# Patient Record
Sex: Female | Born: 1965 | Race: White | Hispanic: No | Marital: Married | Smoking: Former smoker
Health system: Southern US, Community
[De-identification: ages and names within clinical notes are randomized; demographics above are authoritative.]

## PROBLEM LIST (undated history)

## (undated) DIAGNOSIS — J189 Pneumonia, unspecified organism: Secondary | ICD-10-CM

## (undated) DIAGNOSIS — G43909 Migraine, unspecified, not intractable, without status migrainosus: Secondary | ICD-10-CM

## (undated) DIAGNOSIS — T7840XA Allergy, unspecified, initial encounter: Secondary | ICD-10-CM

## (undated) DIAGNOSIS — K219 Gastro-esophageal reflux disease without esophagitis: Secondary | ICD-10-CM

## (undated) DIAGNOSIS — M1990 Unspecified osteoarthritis, unspecified site: Secondary | ICD-10-CM

## (undated) DIAGNOSIS — N390 Urinary tract infection, site not specified: Secondary | ICD-10-CM

## (undated) DIAGNOSIS — I480 Paroxysmal atrial fibrillation: Secondary | ICD-10-CM

## (undated) DIAGNOSIS — F419 Anxiety disorder, unspecified: Secondary | ICD-10-CM

## (undated) HISTORY — PX: WRIST SURGERY: SHX841

## (undated) HISTORY — DX: Pneumonia, unspecified organism: J18.9

## (undated) HISTORY — DX: Anxiety disorder, unspecified: F41.9

## (undated) HISTORY — DX: Unspecified osteoarthritis, unspecified site: M19.90

## (undated) HISTORY — DX: Gastro-esophageal reflux disease without esophagitis: K21.9

## (undated) HISTORY — DX: Allergy, unspecified, initial encounter: T78.40XA

## (undated) HISTORY — PX: TUBAL LIGATION: SHX77

## (undated) HISTORY — DX: Migraine, unspecified, not intractable, without status migrainosus: G43.909

## (undated) HISTORY — PX: OTHER SURGICAL HISTORY: SHX169

## (undated) HISTORY — DX: Urinary tract infection, site not specified: N39.0

---

## 1998-01-09 ENCOUNTER — Inpatient Hospital Stay (HOSPITAL_COMMUNITY): Admission: AD | Admit: 1998-01-09 | Discharge: 1998-01-09 | Payer: Self-pay | Admitting: Obstetrics and Gynecology

## 1998-01-12 ENCOUNTER — Inpatient Hospital Stay (HOSPITAL_COMMUNITY): Admission: AD | Admit: 1998-01-12 | Discharge: 1998-01-12 | Payer: Self-pay | Admitting: Obstetrics and Gynecology

## 1998-01-12 ENCOUNTER — Inpatient Hospital Stay (HOSPITAL_COMMUNITY): Admission: AD | Admit: 1998-01-12 | Discharge: 1998-01-15 | Payer: Self-pay | Admitting: Obstetrics and Gynecology

## 1998-04-27 ENCOUNTER — Ambulatory Visit (HOSPITAL_BASED_OUTPATIENT_CLINIC_OR_DEPARTMENT_OTHER): Admission: RE | Admit: 1998-04-27 | Discharge: 1998-04-27 | Payer: Self-pay | Admitting: General Surgery

## 1998-06-18 ENCOUNTER — Emergency Department (HOSPITAL_COMMUNITY): Admission: EM | Admit: 1998-06-18 | Discharge: 1998-06-18 | Payer: Self-pay | Admitting: Emergency Medicine

## 1998-06-18 ENCOUNTER — Encounter: Payer: Self-pay | Admitting: Emergency Medicine

## 1998-06-24 ENCOUNTER — Ambulatory Visit (HOSPITAL_COMMUNITY): Admission: RE | Admit: 1998-06-24 | Discharge: 1998-06-24 | Payer: Self-pay | Admitting: Obstetrics and Gynecology

## 1998-07-03 ENCOUNTER — Other Ambulatory Visit: Admission: RE | Admit: 1998-07-03 | Discharge: 1998-07-03 | Payer: Self-pay | Admitting: Obstetrics and Gynecology

## 1999-08-04 ENCOUNTER — Ambulatory Visit (HOSPITAL_COMMUNITY): Admission: RE | Admit: 1999-08-04 | Discharge: 1999-08-04 | Payer: Self-pay | Admitting: Emergency Medicine

## 1999-08-04 ENCOUNTER — Encounter: Payer: Self-pay | Admitting: Emergency Medicine

## 1999-09-16 ENCOUNTER — Other Ambulatory Visit: Admission: RE | Admit: 1999-09-16 | Discharge: 1999-09-16 | Payer: Self-pay | Admitting: Obstetrics and Gynecology

## 2000-11-30 ENCOUNTER — Other Ambulatory Visit: Admission: RE | Admit: 2000-11-30 | Discharge: 2000-11-30 | Payer: Self-pay | Admitting: Obstetrics and Gynecology

## 2001-11-28 ENCOUNTER — Ambulatory Visit (HOSPITAL_COMMUNITY): Admission: RE | Admit: 2001-11-28 | Discharge: 2001-11-28 | Payer: Self-pay | Admitting: General Surgery

## 2001-11-28 ENCOUNTER — Encounter (INDEPENDENT_AMBULATORY_CARE_PROVIDER_SITE_OTHER): Payer: Self-pay | Admitting: Specialist

## 2003-06-10 ENCOUNTER — Encounter: Admission: RE | Admit: 2003-06-10 | Discharge: 2003-06-10 | Payer: Self-pay | Admitting: Obstetrics and Gynecology

## 2003-09-13 ENCOUNTER — Emergency Department (HOSPITAL_COMMUNITY): Admission: AD | Admit: 2003-09-13 | Discharge: 2003-09-13 | Payer: Self-pay | Admitting: Family Medicine

## 2003-09-16 HISTORY — PX: COLONOSCOPY: SHX174

## 2004-04-06 ENCOUNTER — Emergency Department (HOSPITAL_COMMUNITY): Admission: EM | Admit: 2004-04-06 | Discharge: 2004-04-06 | Payer: Self-pay | Admitting: Family Medicine

## 2004-04-06 ENCOUNTER — Ambulatory Visit (HOSPITAL_COMMUNITY): Admission: RE | Admit: 2004-04-06 | Discharge: 2004-04-06 | Payer: Self-pay | Admitting: Family Medicine

## 2005-01-21 ENCOUNTER — Emergency Department (HOSPITAL_COMMUNITY): Admission: EM | Admit: 2005-01-21 | Discharge: 2005-01-21 | Payer: Self-pay | Admitting: Emergency Medicine

## 2005-01-24 ENCOUNTER — Encounter (HOSPITAL_COMMUNITY): Admission: RE | Admit: 2005-01-24 | Discharge: 2005-02-23 | Payer: Self-pay | Admitting: Emergency Medicine

## 2005-05-05 ENCOUNTER — Encounter: Admission: RE | Admit: 2005-05-05 | Discharge: 2005-05-05 | Payer: Self-pay | Admitting: Family Medicine

## 2005-06-15 ENCOUNTER — Emergency Department (HOSPITAL_COMMUNITY): Admission: EM | Admit: 2005-06-15 | Discharge: 2005-06-15 | Payer: Self-pay | Admitting: Family Medicine

## 2005-08-15 ENCOUNTER — Encounter: Admission: RE | Admit: 2005-08-15 | Discharge: 2005-08-15 | Payer: Self-pay | Admitting: Gastroenterology

## 2005-09-19 ENCOUNTER — Encounter: Admission: RE | Admit: 2005-09-19 | Discharge: 2005-09-19 | Payer: Self-pay | Admitting: Obstetrics and Gynecology

## 2005-09-29 ENCOUNTER — Other Ambulatory Visit: Admission: RE | Admit: 2005-09-29 | Discharge: 2005-09-29 | Payer: Self-pay | Admitting: Obstetrics and Gynecology

## 2006-01-18 ENCOUNTER — Emergency Department (HOSPITAL_COMMUNITY): Admission: EM | Admit: 2006-01-18 | Discharge: 2006-01-19 | Payer: Self-pay | Admitting: Emergency Medicine

## 2006-04-07 ENCOUNTER — Ambulatory Visit (HOSPITAL_COMMUNITY): Admission: RE | Admit: 2006-04-07 | Discharge: 2006-04-07 | Payer: Self-pay | Admitting: Obstetrics and Gynecology

## 2006-06-05 ENCOUNTER — Encounter: Admission: RE | Admit: 2006-06-05 | Discharge: 2006-06-05 | Payer: Self-pay | Admitting: Family Medicine

## 2006-06-05 ENCOUNTER — Ambulatory Visit: Payer: Self-pay | Admitting: Family Medicine

## 2006-07-28 ENCOUNTER — Emergency Department (HOSPITAL_COMMUNITY): Admission: EM | Admit: 2006-07-28 | Discharge: 2006-07-28 | Payer: Self-pay | Admitting: Family Medicine

## 2006-09-21 ENCOUNTER — Encounter: Admission: RE | Admit: 2006-09-21 | Discharge: 2006-09-21 | Payer: Self-pay | Admitting: Obstetrics and Gynecology

## 2007-02-16 ENCOUNTER — Ambulatory Visit (HOSPITAL_COMMUNITY): Admission: RE | Admit: 2007-02-16 | Discharge: 2007-02-16 | Payer: Self-pay | Admitting: Obstetrics and Gynecology

## 2007-05-17 ENCOUNTER — Ambulatory Visit: Payer: Self-pay | Admitting: Family Medicine

## 2007-06-12 ENCOUNTER — Ambulatory Visit: Payer: Self-pay | Admitting: Family Medicine

## 2007-09-28 ENCOUNTER — Encounter: Admission: RE | Admit: 2007-09-28 | Discharge: 2007-09-28 | Payer: Self-pay | Admitting: Obstetrics and Gynecology

## 2008-02-18 ENCOUNTER — Emergency Department (HOSPITAL_COMMUNITY): Admission: EM | Admit: 2008-02-18 | Discharge: 2008-02-18 | Payer: Self-pay | Admitting: *Deleted

## 2008-09-29 ENCOUNTER — Encounter: Admission: RE | Admit: 2008-09-29 | Discharge: 2008-09-29 | Payer: Self-pay | Admitting: Obstetrics and Gynecology

## 2008-12-17 ENCOUNTER — Emergency Department (HOSPITAL_COMMUNITY): Admission: EM | Admit: 2008-12-17 | Discharge: 2008-12-17 | Payer: Self-pay | Admitting: Family Medicine

## 2008-12-17 ENCOUNTER — Emergency Department (HOSPITAL_COMMUNITY): Admission: EM | Admit: 2008-12-17 | Discharge: 2008-12-18 | Payer: Self-pay | Admitting: Emergency Medicine

## 2009-09-30 ENCOUNTER — Encounter: Admission: RE | Admit: 2009-09-30 | Discharge: 2009-09-30 | Payer: Self-pay | Admitting: Obstetrics and Gynecology

## 2010-08-08 ENCOUNTER — Encounter: Payer: Self-pay | Admitting: Gastroenterology

## 2010-08-08 ENCOUNTER — Encounter: Payer: Self-pay | Admitting: Family Medicine

## 2010-08-13 ENCOUNTER — Other Ambulatory Visit: Payer: Self-pay | Admitting: Family Medicine

## 2010-08-13 DIAGNOSIS — Z1239 Encounter for other screening for malignant neoplasm of breast: Secondary | ICD-10-CM

## 2010-10-04 ENCOUNTER — Ambulatory Visit
Admission: RE | Admit: 2010-10-04 | Discharge: 2010-10-04 | Disposition: A | Discharge status: Home / Self Care | Payer: BC Managed Care – PPO | Source: Ambulatory Visit | Attending: Family Medicine | Admitting: Family Medicine

## 2010-10-04 DIAGNOSIS — Z1239 Encounter for other screening for malignant neoplasm of breast: Secondary | ICD-10-CM

## 2010-10-14 ENCOUNTER — Other Ambulatory Visit: Payer: Self-pay | Admitting: Dermatology

## 2010-10-25 LAB — WET PREP, GENITAL: Yeast Wet Prep HPF POC: NONE SEEN

## 2010-10-25 LAB — POCT URINALYSIS DIP (DEVICE)
Hgb urine dipstick: NEGATIVE
Ketones, ur: NEGATIVE mg/dL
Protein, ur: NEGATIVE mg/dL
Specific Gravity, Urine: <=1.005 (ref 1.005–1.030)
pH: 5.5 (ref 5.0–8.0)

## 2010-10-25 LAB — POCT I-STAT, CHEM 8
Creatinine, Ser: 0.9 mg/dL (ref 0.4–1.2)
Glucose, Bld: 75 mg/dL (ref 70–99)
HCT: 39 % (ref 36.0–46.0)
Hemoglobin: 13.3 g/dL (ref 12.0–15.0)
Sodium: 141 mEq/L (ref 135–145)
TCO2: 23 mmol/L (ref 0–100)

## 2010-10-25 LAB — GC/CHLAMYDIA PROBE AMP, GENITAL
Chlamydia, DNA Probe: NEGATIVE
GC Probe Amp, Genital: NEGATIVE

## 2010-11-11 ENCOUNTER — Other Ambulatory Visit: Payer: Self-pay | Admitting: Family Medicine

## 2010-11-11 DIAGNOSIS — R102 Pelvic and perineal pain: Secondary | ICD-10-CM

## 2010-11-12 ENCOUNTER — Ambulatory Visit
Admission: RE | Admit: 2010-11-12 | Discharge: 2010-11-12 | Disposition: A | Discharge status: Home / Self Care | Payer: BC Managed Care – PPO | Source: Ambulatory Visit | Attending: Family Medicine | Admitting: Family Medicine

## 2010-11-12 DIAGNOSIS — R102 Pelvic and perineal pain: Secondary | ICD-10-CM

## 2010-11-30 NOTE — Op Note (Signed)
NAMELOUISIANA, SEARLES           ACCOUNT NO.:  [id number]   MEDICAL RECORD NO.:  [id number]          PATIENT TYPE:  AMB   LOCATION:  SDC                           FACILITY:  [facility]   PHYSICIAN:  Zenaida Niece, M.D.DATE OF BIRTH:  Jan 10, 1966   DATE OF PROCEDURE:  02/16/2007  DATE OF DISCHARGE:                               OPERATIVE REPORT   PREOPERATIVE DIAGNOSIS:  Abnormal uterine bleeding.   POSTOPERATIVE DIAGNOSIS:  Abnormal uterine bleeding.   PROCEDURE:  Hysteroscopy with Novasure endometrial ablation.   SURGEON:  Zenaida Niece, MD.   ANESTHESIA:  IV sedation and paracervical block.   FINDINGS:  Normal uterine cavity, and the Novasure device used a cavity  length of 5 cm, cavity width of 2.9 cm and used 80 watts for 77 seconds.   SPECIMENS:  None.   ESTIMATED BLOOD LOSS:  Minimal.   COMPLICATIONS:  None.   PROCEDURE IN DETAIL:  The patient was taken to the operating room and  placed in the dorsosupine position.  She was given IV sedation and  placed in mobile stirrups.  Perineum and vagina were then prepped and  draped in the usual sterile fashion and bladder drained with a latex-  free catheter.  A Graves speculum was inserted into the vagina, and 2 cc  of 2% plain lidocaine was infiltrated at 12 o'clock.  The cervix was  then grasped with a single-tooth tenaculum.  A deep paracervical block  was then performed with a total of 16 cc of 2% plain lidocaine.  The  uterus then sounded to 9 cm, and the cervix measured 4 cm.  The cervix  was gradually dilated to a size-19 dilator, and the small hysteroscope  was inserted, and good visualization was achieved.  Both tubal ostia  were identified, there were no endometrial lesions, and the cavity was  normal.  The hysteroscope was removed.  The cervix was gradually dilated  to accommodate a size-8 Hegar dilator.  The Novasure device was then  inserted without difficulty.  It was deployed appropriately without  problems.   The CO2 test passed, and endometrial ablation was performed  with the above-mentioned settings without complications.  The device was  removed and found to be intact.  The single-tooth  tenaculum was removed from the cervix and bleeding controlled with  pressure.  All instruments were then removed from the vagina.  The  patient tolerated the procedure well and was taken to the recovery room  in stable condition.  Counts were correct.      Zenaida Niece, M.D.  Electronically Signed     TDM/MEDQ  D:  02/16/2007  T:  02/16/2007  Job:  [id number]

## 2010-12-03 NOTE — Op Note (Signed)
Kim Irwin, Kim Irwin           ACCOUNT NO.:  [id number]   MEDICAL RECORD NO.:  [id number]          PATIENT TYPE:  AMB   LOCATION:  SDC                           FACILITY:  [facility]   PHYSICIAN:  Zenaida Niece, M.D.DATE OF BIRTH:  Aug 01, 1965   DATE OF PROCEDURE:  04/07/2006  DATE OF DISCHARGE:  04/07/2006                                 OPERATIVE REPORT   PREOPERATIVE DIAGNOSIS:  Pelvic pain and desires surgical sterility.   POSTOPERATIVE DIAGNOSIS:  Pelvic pain and desires surgical sterility.   PROCEDURE:  Diagnostic laparoscopy with bilateral tubal fulguration.   SURGEON:  Zenaida Niece, M.D.   ANESTHESIA:  General endotracheal tube.   SPECIMENS:  None.   ESTIMATED BLOOD LOSS:  Minimal.   COMPLICATIONS:  None.   FINDINGS:  She had normal female pelvic anatomy and upper abdominal anatomy.   PROCEDURE IN DETAIL:  The patient was taken to the operating room and placed  in the dorsal supine position.  General anesthesia was induced and she was  placed in mobile stirrups.  The abdomen was prepped and draped in the usual  sterile fashion, the bladder drained with a red rubber catheter, Hulka  tenaculum applied to the cervix for uterine manipulation.  Infraumbilical  skin was then infiltrated with 0.25% Marcaine and a 1 cm vertical incision  was made in the periumbilical region.  The Veress needle was inserted in the  peritoneal cavity and placement confirmed by the water drop test and opening  pressure of 4 mmHg.  CO2 gas was insufflated to a pressure of 10 mmHg and  the Veress needle was removed.  The 5-mm XL trocar was then introduced with  direct visualization with the laparoscope.  A 5-mm port was then placed on  the left side, also under direct visualization.  Inspection revealed the  above mentioned findings.  Sh had a normal liver edge and normal appendix  and gallbladder.  The uterus was normal. Tubes and ovaries were normal.  No  evidence of endometriosis or  adhesions.  Both fallopian tubes were  identified and traced to their fimbriated ends.  Three segments of the  middle of each tube were fulgurated with bipolar cautery until the amp meter  read 0 in all spots.  The pelvis was again inspected and no source for her  pain was identified.  The 5 mL port on the left side was removed under  direct visualization.  The laparoscope was removed and all gas allowed to  deflate from the abdomen.  The umbilical trocar was then removed.  Skin  incisions  were closed with interrupted subcuticular sutures of 4-0 Vicryl followed by  Dermabond.  The Hulka tenaculum was removed from the cervix.  The patient  tolerated the procedure well and was taken to the recovery room in stable  condition.  Counts were correct and she had PAS hose on throughout the  procedure.      Zenaida Niece, M.D.  Electronically Signed     TDM/MEDQ  D:  04/07/2006  T:  04/09/2006  Job:  [id number]

## 2010-12-03 NOTE — H&P (Signed)
[name], Irwin           ACCOUNT NO.:  [id number]   MEDICAL RECORD NO.:  [id number]          PATIENT TYPE:  AMB   LOCATION:  SDC                           FACILITY:  [facility]   PHYSICIAN:  Zenaida Niece, M.D.DATE OF BIRTH:  1966/04/09   DATE OF ADMISSION:  04/07/2006  DATE OF DISCHARGE:                                HISTORY & PHYSICAL   CHIEF COMPLAINT:  Pelvic pain.   HISTORY OF PRESENT ILLNESS:  This is a 45 year old white female, gravida 1  para 1, whom I saw for an annual exam in March 2007.  At that time, she was  having occasional spotting or light periods and no significant problems.  I  then saw her back in April 2007, at which time she complained of having  pelvic pain off and on since August 2006.  In April 2007, it was becoming  more constant and more to the right and the middle, radiating to her back,  and associated with pelvic bloating especially with voiding.  She also had  some constipation.  She said the pain was better; she felt some better when  lying flat with a heating pad.  She also has some dyspareunia when she is on  her back, but it is better when she is on her knees.  She also has mild  nausea, but no emesis, and no fever or chills.  She says that she was seen  in November 2006 for the same symptoms, but nobody was able to find an  etiology.  I do not have record of her being seen for that here in our  office.  Exam at that time revealed a benign abdomen that was slightly  tender in the suprapubic and right lower quadrant areas.  Pelvic exam  revealed an anteverted to midplanar possibly slightly tender uterus and  possibly slightly tender bladder.  She does have a Mirena IUD in place, and  the strings were appropriate at approximately 2 cm.  At that time, she was  placed on doxycycline 100 mg b.i.d. and to take over-the-counter NSAIDs to  see how this helped.  She then required Vicoprofen to control her pain, and  the pain continued to worsen.  She  was seen in June 2007, again saying that  the pain was still the same.  She did have a pelvic ultrasound performed  initially on November 04, 2005, which revealed a moderate-size simple right  ovarian cyst.  Pelvic ultrasound was otherwise normal.  Repeat ultrasound on  December 27, 2005 revealed decreased size of the right ovarian cyst and a small  amount of free fluid, and again an otherwise normal ultrasound.  When I saw  the patient in June 2007, we discussed the options of observation, removing  the IUD, or proceeding to laparoscopy or possible hysterectomy.  She wished  to proceed with removal of the Mirena IUD at that time, and that was  performed.  A NuvaRing was placed for contraception.  She has continued to  require Vicoprofen for pain and wishes to proceed with laparoscopic  evaluation.  She also wants to proceed with permanent  surgical sterility.   PAST OB HISTORY:  One spontaneous vaginal delivery at term, without  complications.   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  By echocardiogram, she does have minimal mitral valve  prolapse, with mild mitral regurgitation, but this was otherwise normal.   PAST SURGICAL HISTORY:  Negative.   ALLERGIES:  None known.   CURRENT MEDICATIONS:  Vicoprofen p.r.n. pain.   SOCIAL HISTORY:  She is married, and denies alcohol, tobacco, or drug use.   FAMILY HISTORY:  No gyn or colon cancer.   REVIEW OF SYSTEMS:  She does have some fatigue and recent weight gain, and  again has fairly normal urinary and bowel function with some constipation.   PHYSICAL EXAMINATION:  GENERAL:  This is a well-developed female in no acute  distress.  Last weight is approximately 150 pounds.  NECK:  Supple, without lymphadenopathy or thyromegaly.  LUNGS:  Clear to auscultation.  HEART:  Regular rate and rhythm, without murmur.  ABDOMEN:  Soft, nondistended, without palpable masses.  She is slightly  tender in the  suprapubic area and right lower quadrant.  EXTREMITIES:  Have no edema and are nontender.  PELVIC:  External genitalia have no lesions.  On speculum, the cervix is  normal, and most recent Pap smear is normal.  On bimanual exam, she does  have a small anteverted to midplanar uterus which is slightly tender, and  she is slightly tender in the right adnexa and anteriorly, but no unusual  masses.   ASSESSMENT:  Chronic pelvic pain, with a normal ultrasound.  Pain has not  improved in the past 3 months after removing her Mirena IUD.  She wishes to  proceed with laparoscopic evaluation as well as tubal ligation.  Risks of  surgery have been discussed, including the permanency and potential failure  rate of tubal ligation.  She understands these risks.  She also understands  that I may not find a source for her pain.   PLAN:  To admit the patient on the day of surgery for a diagnostic  laparoscopy with possible adhesiolysis or fulguration of endometriosis and  bilateral tubal fulguration.      Zenaida Niece, M.D.  Electronically Signed     TDM/MEDQ  D:  04/06/2006  T:  04/07/2006  Job:  [id number]

## 2010-12-03 NOTE — Op Note (Signed)
[facility]  Patient:    [name], BEAM Visit Number: [id number] MRN: [id number]          Service Type: Attending:  Lorne Skeens. Hoxworth, M.D. Dictated by:   Lorne Skeens. Hoxworth, M.D. Proc. Date: 11/28/01                             Operative Report  PREOPERATIVE DIAGNOSIS:  Subacute mass, right groin.  POSTOPERATIVE DIAGNOSIS:  Subacute mass, right groin, consistent with lipoma.  SURGICAL PROCEDURE:  Excision of subacute mass, right groin.  SURGEON:  Lorne Skeens. Hoxworth, M.D.  ANESTHESIA:  Local with IV sedation.  BRIEF HISTORY:  Kim Irwin is a 45 year old white female who presents with a tender lump in the right inguinal canal measuring about 2 cm. This has persisted on follow-up and clinically is consistent with lymphadenopathy versus subcu benign neoplasm such lipoma or fibroma. Due to the persistence in question about the diagnosis, we have elected to proceed with excision. The nature of the procedure, indications and risks were discussed preoperatively. She is now brought to the operating room for this procedure.  DESCRIPTION OF PROCEDURE:  The patient was brought to the operating room, placed in supine position on the operating table and IV sedation was administered. The right groin was sterilely prepped and draped. The tissue around the mass was infiltrated with local anesthesia. A small incision was made in the skin crease and dissection carried down through the subcutaneous tissue sharply. One crossing vein was doubly clamped, divided and ligated with 3-0 Vicryl. Scarpas fascia was excised and a typical appearing well encapsulated lipoma bulged up from below Scarpas lying on the external oblique. This was sharply completely excised. Careful palpation in the area revealed no other mass deep to the external oblique or in the surrounding subcutaneous tissue. Following this, the Scarpas fascia was closed with interrupted 4-0  monocryl and the skin with running subcuticular running monocryl and Steri-Strips. Sponge, needle and instrument counts were correct. A dry sterile dressing was applied and the patient was taken to the recovery room in good condition. Dictated by:   Lorne Skeens. Hoxworth, M.D. Attending:  Lorne Skeens. Hoxworth, M.D. DD:  11/28/01 TD:  11/29/01 Job: [id number]

## 2011-04-15 LAB — DIFFERENTIAL
Basophils Absolute: 0
Lymphocytes Relative: 34
Monocytes Absolute: 0.7
Monocytes Relative: 9
Neutro Abs: 4.1

## 2011-04-15 LAB — COMPREHENSIVE METABOLIC PANEL
Albumin: 3.9
BUN: 13
Calcium: 8.9
Creatinine, Ser: 0.79
Total Bilirubin: 0.5
Total Protein: 6.7

## 2011-04-15 LAB — URINALYSIS, ROUTINE W REFLEX MICROSCOPIC
Bilirubin Urine: NEGATIVE
Hgb urine dipstick: NEGATIVE
Protein, ur: NEGATIVE
Urobilinogen, UA: 0.2

## 2011-04-15 LAB — CBC
HCT: 38.4
MCHC: 33.6
MCV: 92.3
Platelets: 227
RDW: 12.7

## 2011-04-15 LAB — WET PREP, GENITAL: Yeast Wet Prep HPF POC: NONE SEEN

## 2011-04-15 LAB — APTT: aPTT: 28

## 2011-04-15 LAB — PROTIME-INR: INR: 1

## 2011-05-02 LAB — CBC
HCT: 39.6
MCV: 91
Platelets: 290
WBC: 8.9

## 2011-07-06 ENCOUNTER — Ambulatory Visit (INDEPENDENT_AMBULATORY_CARE_PROVIDER_SITE_OTHER): Payer: BC Managed Care – PPO

## 2011-07-06 DIAGNOSIS — J209 Acute bronchitis, unspecified: Secondary | ICD-10-CM

## 2011-08-20 ENCOUNTER — Ambulatory Visit (INDEPENDENT_AMBULATORY_CARE_PROVIDER_SITE_OTHER): Payer: BC Managed Care – PPO | Admitting: Family Medicine

## 2011-08-20 VITALS — BP 114/76 | HR 83 | Temp 98.6°F | Resp 16 | Ht 68.0 in | Wt 142.6 lb

## 2011-08-20 DIAGNOSIS — M7530 Calcific tendinitis of unspecified shoulder: Secondary | ICD-10-CM

## 2011-08-20 DIAGNOSIS — M25519 Pain in unspecified shoulder: Secondary | ICD-10-CM

## 2011-08-20 DIAGNOSIS — Z0000 Encounter for general adult medical examination without abnormal findings: Secondary | ICD-10-CM | POA: Insufficient documentation

## 2011-08-20 MED ORDER — HYDROCODONE-ACETAMINOPHEN 5-500 MG PO TABS: 1.0000 | ORAL_TABLET | Freq: Three times a day (TID) | ORAL | Status: AC | PRN

## 2011-08-20 MED ORDER — METHYLPREDNISOLONE 4 MG PO KIT: PACK | ORAL | Status: AC

## 2011-08-20 NOTE — Progress Notes (Signed)
  Subjective:    Patient ID: Kim Irwin, female    DOB: Jan 08, 1966, 46 y.o.   MRN: [id number]  Shoulder Pain  This is a new problem. The current episode started more than 1 month ago. There has been no history of extremity trauma. The problem occurs constantly. The problem has been gradually worsening. The pain is at a severity of 7/10. The pain is moderate. She has tried NSAIDS for the symptoms. The treatment provided no relief.      Review of Systems     Objective:   Physical Exam  Shoulder is tender at anterior joint line with decrease ROM (abduction and internal rotation).  Pain with empty can test.      Assessment & Plan:  Bursitis of rotator cuff.

## 2011-08-20 NOTE — Patient Instructions (Signed)
Impingement Syndrome, Rotator Cuff, Bursitis with Rehab Impingement syndrome is a condition that involves inflammation of the tendons of the rotator cuff and the subacromial bursa, that causes pain in the shoulder. The rotator cuff consists of four tendons and muscles that control much of the shoulder and upper arm function. The subacromial bursa is a fluid filled sac that helps reduce friction between the rotator cuff and one of the bones of the shoulder (acromion). Impingement syndrome is usually an overuse injury that causes swelling of the bursa (bursitis), swelling of the tendon (tendonitis), and/or a tear of the tendon (strain). Strains are classified into three categories. Grade 1 strains cause pain, but the tendon is not lengthened. Grade 2 strains include a lengthened ligament, due to the ligament being stretched or partially ruptured. With grade 2 strains there is still function, although the function may be decreased. Grade 3 strains include a complete tear of the tendon or muscle, and function is usually impaired. SYMPTOMS   Pain around the shoulder, often at the outer portion of the upper arm.   Pain that gets worse with shoulder function, especially when reaching overhead or lifting.   Sometimes, aching when not using the arm.   Pain that wakes you up at night.   Sometimes, tenderness, swelling, warmth, or redness over the affected area.   Loss of strength.   Limited motion of the shoulder, especially reaching behind the back (to the back pocket or to unhook bra) or across your body.   Crackling sound (crepitation) when moving the arm.   Biceps tendon pain and inflammation (in the front of the shoulder). Worse when bending the elbow or lifting.  CAUSES  Impingement syndrome is often an overuse injury, in which chronic (repetitive) motions cause the tendons or bursa to become inflamed. A strain occurs when a force is paced on the tendon or muscle that is greater than it can  withstand. Common mechanisms of injury include: Stress from sudden increase in duration, frequency, or intensity of training.  Direct hit (trauma) to the shoulder.   Aging, erosion of the tendon with normal use.   Bony bump on shoulder (acromial spur).  RISK INCREASES WITH:  Contact sports (football, wrestling, boxing).   Throwing sports (baseball, tennis, volleyball).   Weightlifting and bodybuilding.   Heavy labor.   Previous injury to the rotator cuff, including impingement.   Poor shoulder strength and flexibility.   Failure to warm up properly before activity.   Inadequate protective equipment.   Old age.   Bony bump on shoulder (acromial spur).  PREVENTION   Warm up and stretch properly before activity.   Allow for adequate recovery between workouts.   Maintain physical fitness:   Strength, flexibility, and endurance.   Cardiovascular fitness.   Learn and use proper exercise technique.  PROGNOSIS  If treated properly, impingement syndrome usually goes away within 6 weeks. Sometimes surgery is required.  RELATED COMPLICATIONS   Longer healing time if not properly treated, or if not given enough time to heal.   Recurring symptoms, that result in a chronic condition.   Shoulder stiffness, frozen shoulder, or loss of motion.   Rotator cuff tendon tear.   Recurring symptoms, especially if activity is resumed too soon, with overuse, with a direct blow, or when using poor technique.  TREATMENT  Treatment first involves the use of ice and medicine, to reduce pain and inflammation. The use of strengthening and stretching exercises may help reduce pain with activity. These exercises may   be performed at home or with a therapist. If non-surgical treatment is unsuccessful after more than 6 months, surgery may be advised. After surgery and rehabilitation, activity is usually possible in 3 months.  MEDICATION  If pain medicine is needed, nonsteroidal  anti-inflammatory medicines (aspirin and ibuprofen), or other minor pain relievers (acetaminophen), are often advised.   Do not take pain medicine for 7 days before surgery.   Prescription pain relievers may be given, if your caregiver thinks they are needed. Use only as directed and only as much as you need.   Corticosteroid injections may be given by your caregiver. These injections should be reserved for the most serious cases, because they may only be given a certain number of times.  HEAT AND COLD  Cold treatment (icing) should be applied for 10 to 15 minutes every 2 to 3 hours for inflammation and pain, and immediately after activity that aggravates your symptoms. Use ice packs or an ice massage.   Heat treatment may be used before performing stretching and strengthening activities prescribed by your caregiver, physical therapist, or athletic trainer. Use a heat pack or a warm water soak.  SEEK MEDICAL CARE IF:   Symptoms get worse or do not improve in 4 to 6 weeks, despite treatment.   New, unexplained symptoms develop. (Drugs used in treatment may produce side effects.)  EXERCISES  RANGE OF MOTION (ROM) AND STRETCHING EXERCISES - Impingement Syndrome (Rotator Cuff  Tendinitis, Bursitis) These exercises may help you when beginning to rehabilitate your injury. Your symptoms may go away with or without further involvement from your physician, physical therapist or athletic trainer. While completing these exercises, remember:   Restoring tissue flexibility helps normal motion to return to the joints. This allows healthier, less painful movement and activity.   An effective stretch should be held for at least 30 seconds.   A stretch should never be painful. You should only feel a gentle lengthening or release in the stretched tissue.  STRETCH - Flexion, Standing  Stand with good posture. With an underhand grip on your right / left hand, and an overhand grip on the opposite hand, grasp  a broomstick or cane so that your hands are a little more than shoulder width apart.   Keeping your right / left elbow straight and shoulder muscles relaxed, push the stick with your opposite hand, to raise your right / left arm in front of your body and then overhead. Raise your arm until you feel a stretch in your right / left shoulder, but before you have increased shoulder pain.   Try to avoid shrugging your right / left shoulder as your arm rises, by keeping your shoulder blade tucked down and toward your mid-back spine. Hold for __________ seconds.   Slowly return to the starting position.  Repeat __________ times. Complete this exercise __________ times per day. STRETCH - Abduction, Supine  Lie on your back. With an underhand grip on your right / left hand and an overhand grip on the opposite hand, grasp a broomstick or cane so that your hands are a little more than shoulder width apart.   Keeping your right / left elbow straight and your shoulder muscles relaxed, push the stick with your opposite hand, to raise your right / left arm out to the side of your body and then overhead. Raise your arm until you feel a stretch in your right / left shoulder, but before you have increased shoulder pain.   Try to avoid shrugging  your right / left shoulder as your arm rises, by keeping your shoulder blade tucked down and toward your mid-back spine. Hold for __________ seconds.   Slowly return to the starting position.  Repeat __________ times. Complete this exercise __________ times per day. ROM - Flexion, Active-Assisted  Lie on your back. You may bend your knees for comfort.   Grasp a broomstick or cane so your hands are about shoulder width apart. Your right / left hand should grip the end of the stick, so that your hand is positioned "thumbs-up," as if you were about to shake hands.   Using your healthy arm to lead, raise your right / left arm overhead, until you feel a gentle stretch in your  shoulder. Hold for __________ seconds.   Use the stick to assist in returning your right / left arm to its starting position.  Repeat __________ times. Complete this exercise __________ times per day.  ROM - Internal Rotation, Supine   Lie on your back on a firm surface. Place your right / left elbow about 60 degrees away from your side. Elevate your elbow with a folded towel, so that the elbow and shoulder are the same height.   Using a broomstick or cane and your strong arm, pull your right / left hand toward your body until you feel a gentle stretch, but no increase in your shoulder pain. Keep your shoulder and elbow in place throughout the exercise.   Hold for __________ seconds. Slowly return to the starting position.  Repeat __________ times. Complete this exercise __________ times per day. STRETCH - Internal Rotation  Place your right / left hand behind your back, palm up.   Throw a towel or belt over your opposite shoulder. Grasp the towel with your right / left hand.   While keeping an upright posture, gently pull up on the towel, until you feel a stretch in the front of your right / left shoulder.   Avoid shrugging your right / left shoulder as your arm rises, by keeping your shoulder blade tucked down and toward your mid-back spine.   Hold for __________ seconds. Release the stretch, by lowering your healthy hand.  Repeat __________ times. Complete this exercise __________ times per day. ROM - Internal Rotation   Using an underhand grip, grasp a stick behind your back with both hands.   While standing upright with good posture, slide the stick up your back until you feel a mild stretch in the front of your shoulder.   Hold for __________ seconds. Slowly return to your starting position.  Repeat __________ times. Complete this exercise __________ times per day.  STRETCH - Posterior Shoulder Capsule   Stand or sit with good posture. Grasp your right / left elbow and draw it  across your chest, keeping it at the same height as your shoulder.   Pull your elbow, so your upper arm comes in closer to your chest. Pull until you feel a gentle stretch in the back of your shoulder.   Hold for __________ seconds.  Repeat __________ times. Complete this exercise __________ times per day. STRENGTHENING EXERCISES - Impingement Syndrome (Rotator Cuff Tendinitis, Bursitis) These exercises may help you when beginning to rehabilitate your injury. They may resolve your symptoms with or without further involvement from your physician, physical therapist or athletic trainer. While completing these exercises, remember:  Muscles can gain both the endurance and the strength needed for everyday activities through controlled exercises.   Complete these exercises as   instructed by your physician, physical therapist or athletic trainer. Increase the resistance and repetitions only as guided.   You may experience muscle soreness or fatigue, but the pain or discomfort you are trying to eliminate should never worsen during these exercises. If this pain does get worse, stop and make sure you are following the directions exactly. If the pain is still present after adjustments, discontinue the exercise until you can discuss the trouble with your clinician.   During your recovery, avoid activity or exercises which involve actions that place your injured hand or elbow above your head or behind your back or head. These positions stress the tissues which you are trying to heal.  STRENGTH - Scapular Depression and Adduction   With good posture, sit on a firm chair. Support your arms in front of you, with pillows, arm rests, or on a table top. Have your elbows in line with the sides of your body.   Gently draw your shoulder blades down and toward your mid-back spine. Gradually increase the tension, without tensing the muscles along the top of your shoulders and the back of your neck.   Hold for  __________ seconds. Slowly release the tension and relax your muscles completely before starting the next repetition.   After you have practiced this exercise, remove the arm support and complete the exercise in standing as well as sitting position.  Repeat __________ times. Complete this exercise __________ times per day.  STRENGTH - Shoulder Abductors, Isometric  With good posture, stand or sit about 4-6 inches from a wall, with your right / left side facing the wall.   Bend your right / left elbow. Gently press your right / left elbow into the wall. Increase the pressure gradually, until you are pressing as hard as you can, without shrugging your shoulder or increasing any shoulder discomfort.   Hold for __________ seconds.   Release the tension slowly. Relax your shoulder muscles completely before you begin the next repetition.  Repeat __________ times. Complete this exercise __________ times per day.  STRENGTH - External Rotators, Isometric  Keep your right / left elbow at your side and bend it 90 degrees.   Step into a door frame so that the outside of your right / left wrist can press against the door frame without your upper arm leaving your side.   Gently press your right / left wrist into the door frame, as if you were trying to swing the back of your hand away from your stomach. Gradually increase the tension, until you are pressing as hard as you can, without shrugging your shoulder or increasing any shoulder discomfort.   Hold for __________ seconds.   Release the tension slowly. Relax your shoulder muscles completely before you begin the next repetition.  Repeat __________ times. Complete this exercise __________ times per day.  STRENGTH - Supraspinatus   Stand or sit with good posture. Grasp a __________ weight, or an exercise band or tubing, so that your hand is "thumbs-up," like you are shaking hands.   Slowly lift your right / left arm in a "V" away from your thigh,  diagonally into the space between your side and straight ahead. Lift your hand to shoulder height or as far as you can, without increasing any shoulder pain. At first, many people do not lift their hands above shoulder height.   Avoid shrugging your right / left shoulder as your arm rises, by keeping your shoulder blade tucked down and toward your mid-back   spine.   Hold for __________ seconds. Control the descent of your hand, as you slowly return to your starting position.  Repeat __________ times. Complete this exercise __________ times per day.  STRENGTH - External Rotators  Secure a rubber exercise band or tubing to a fixed object (table, pole) so that it is at the same height as your right / left elbow when you are standing or sitting on a firm surface.   Stand or sit so that the secured exercise band is at your uninjured side.   Bend your right / left elbow 90 degrees. Place a folded towel or small pillow under your right / left arm, so that your elbow is a few inches away from your side.   Keeping the tension on the exercise band, pull it away from your body, as if pivoting on your elbow. Be sure to keep your body steady, so that the movement is coming only from your rotating shoulder.   Hold for __________ seconds. Release the tension in a controlled manner, as you return to the starting position.  Repeat __________ times. Complete this exercise __________ times per day.  STRENGTH - Internal Rotators   Secure a rubber exercise band or tubing to a fixed object (table, pole) so that it is at the same height as your right / left elbow when you are standing or sitting on a firm surface.   Stand or sit so that the secured exercise band is at your right / left side.   Bend your elbow 90 degrees. Place a folded towel or small pillow under your right / left arm so that your elbow is a few inches away from your side.   Keeping the tension on the exercise band, pull it across your body,  toward your stomach. Be sure to keep your body steady, so that the movement is coming only from your rotating shoulder.   Hold for __________ seconds. Release the tension in a controlled manner, as you return to the starting position.  Repeat __________ times. Complete this exercise __________ times per day.  STRENGTH - Scapular Protractors, Standing   Stand arms length away from a wall. Place your hands on the wall, keeping your elbows straight.   Begin by dropping your shoulder blades down and toward your mid-back spine.   To strengthen your protractors, keep your shoulder blades down, but slide them forward on your rib cage. It will feel as if you are lifting the back of your rib cage away from the wall. This is a subtle motion and can be challenging to complete. Ask your caregiver for further instruction, if you are not sure you are doing the exercise correctly.   Hold for __________ seconds. Slowly return to the starting position, resting the muscles completely before starting the next repetition.  Repeat __________ times. Complete this exercise __________ times per day. STRENGTH - Scapular Protractors, Supine  Lie on your back on a firm surface. Extend your right / left arm straight into the air while holding a __________ weight in your hand.   Keeping your head and back in place, lift your shoulder off the floor.   Hold for __________ seconds. Slowly return to the starting position, and allow your muscles to relax completely before starting the next repetition.  Repeat __________ times. Complete this exercise __________ times per day. STRENGTH - Scapular Protractors, Quadruped  Get onto your hands and knees, with your shoulders directly over your hands (or as close as you can   be, comfortably).   Keeping your elbows locked, lift the back of your rib cage up into your shoulder blades, so your mid-back rounds out. Keep your neck muscles relaxed.   Hold this position for __________  seconds. Slowly return to the starting position and allow your muscles to relax completely before starting the next repetition.  Repeat __________ times. Complete this exercise __________ times per day.  STRENGTH - Scapular Retractors  Secure a rubber exercise band or tubing to a fixed object (table, pole), so that it is at the height of your shoulders when you are either standing, or sitting on a firm armless chair.   With a palm down grip, grasp an end of the band in each hand. Straighten your elbows and lift your hands straight in front of you, at shoulder height. Step back, away from the secured end of the band, until it becomes tense.   Squeezing your shoulder blades together, draw your elbows back toward your sides, as you bend them. Keep your upper arms lifted away from your body throughout the exercise.   Hold for __________ seconds. Slowly ease the tension on the band, as you reverse the directions and return to the starting position.  Repeat __________ times. Complete this exercise __________ times per day. STRENGTH - Shoulder Extensors   Secure a rubber exercise band or tubing to a fixed object (table, pole) so that it is at the height of your shoulders when you are either standing, or sitting on a firm armless chair.   With a thumbs-up grip, grasp an end of the band in each hand. Straighten your elbows and lift your hands straight in front of you, at shoulder height. Step back, away from the secured end of the band, until it becomes tense.   Squeezing your shoulder blades together, pull your hands down to the sides of your thighs. Do not allow your hands to go behind you.   Hold for __________ seconds. Slowly ease the tension on the band, as you reverse the directions and return to the starting position.  Repeat __________ times. Complete this exercise __________ times per day.  STRENGTH - Scapular Retractors and External Rotators   Secure a rubber exercise band or tubing to a  fixed object (table, pole) so that it is at the height as your shoulders, when you are either standing, or sitting on a firm armless chair.   With a palm down grip, grasp an end of the band in each hand. Bend your elbows 90 degrees and lift your elbows to shoulder height, at your sides. Step back, away from the secured end of the band, until it becomes tense.   Squeezing your shoulder blades together, rotate your shoulders so that your upper arms and elbows remain stationary, but your fists travel upward to head height.   Hold for __________ seconds. Slowly ease the tension on the band, as you reverse the directions and return to the starting position.  Repeat __________ times. Complete this exercise __________ times per day.  STRENGTH - Scapular Retractors and External Rotators, Rowing   Secure a rubber exercise band or tubing to a fixed object (table, pole) so that it is at the height of your shoulders, when you are either standing, or sitting on a firm armless chair.   With a palm down grip, grasp an end of the band in each hand. Straighten your elbows and lift your hands straight in front of you, at shoulder height. Step back, away from the   secured end of the band, until it becomes tense.   Step 1: Squeeze your shoulder blades together. Bending your elbows, draw your hands to your chest, as if you are rowing a boat. At the end of this motion, your hands and elbow should be at shoulder height and your elbows should be out to your sides.   Step 2: Rotate your shoulders, to raise your hands above your head. Your forearms should be vertical and your upper arms should be horizontal.   Hold for __________ seconds. Slowly ease the tension on the band, as you reverse the directions and return to the starting position.  Repeat __________ times. Complete this exercise __________ times per day.  STRENGTH - Scapular Depressors  Find a sturdy chair without wheels, such as a dining room chair.    Keeping your feet on the floor, and your hands on the chair arms, lift your bottom up from the seat, and lock your elbows.   Keeping your elbows straight, allow gravity to pull your body weight down. Your shoulders will rise toward your ears.   Raise your body against gravity by drawing your shoulder blades down your back, shortening the distance between your shoulders and ears. Although your feet should always maintain contact with the floor, your feet should progressively support less body weight, as you get stronger.   Hold for __________ seconds. In a controlled and slow manner, lower your body weight to begin the next repetition.  Repeat __________ times. Complete this exercise __________ times per day.  Document Released: 07/04/2005 Document Revised: 03/16/2011 Document Reviewed: 10/16/2008 [facility] Patient Information 2012 [place], [place].Rotator Cuff Tendonitis  The rotator cuff is the collection of all the muscles and tendons (the supraspinatus, infraspinatus, subscapularis, and teres minor muscles and their tendons) that help your shoulder stay in place. This unit holds the head of the upper arm bone (humerus) in the cup (fossa) of the shoulder blade (scapula). Basically, it connects the arm to the shoulder. Tendinitis is a swelling and irritation of the tissue, called cord like structures (tendons) that connect muscle to bone. It usually is caused by overusing the joint involved. When the tissue surrounding a tendon (the synovium) becomes inflamed, it is called tenosynovitis. This also is often the result of overuse in people whose jobs require repetitive (over and over again) types of motion. HOME CARE INSTRUCTIONS   Use a sling or splint for as long as directed by your caregiver until the pain decreases.   Apply ice to the injury for 15 to 20 minutes, 3 to 4 times per day. Put the ice in a plastic bag and place a towel between the bag of ice and your skin.   Try to avoid use other  than gentle range of motion while your shoulder is painful. Use and exercise only as directed by your caregiver. Stop exercises or range of motion if pain or discomfort increases, unless directed otherwise by your caregiver.   Only take over-the-counter or prescription medicines for pain, discomfort, or fever as directed by your caregiver.   If you were give a shoulder sling and straps (immobilizer), do not remove it except as directed, or until you see a caregiver for a follow-up examination. If you need to remove it, move your arm as little as possible or as directed.   You may want to sleep on several pillows at night to lessen swelling and pain.  SEEK IMMEDIATE MEDICAL CARE IF:   Pain in your shoulder increases or new  pain develops in your arm, hand, or fingers and is not relieved with medications.   You develop new, unexplained symptoms, especially increased numbness in the hands or loss of strength, or you develop any worsening of the problems which brought you in for care.   Your arm, hand, or fingers are numb or tingling.   Your arm, hand, or fingers are swollen, painful, or turn white or blue.  Document Released: 09/24/2003 Document Revised: 03/16/2011 Document Reviewed: 05/01/2008 [facility] Patient Information 2012 [place], [place].

## 2011-09-02 ENCOUNTER — Other Ambulatory Visit: Payer: Self-pay | Admitting: Family Medicine

## 2011-09-02 DIAGNOSIS — Z1231 Encounter for screening mammogram for malignant neoplasm of breast: Secondary | ICD-10-CM

## 2011-09-14 ENCOUNTER — Ambulatory Visit (INDEPENDENT_AMBULATORY_CARE_PROVIDER_SITE_OTHER): Payer: BC Managed Care – PPO | Admitting: Family Medicine

## 2011-09-14 VITALS — BP 105/64 | HR 78 | Temp 98.1°F | Resp 16 | Ht 67.5 in | Wt 142.0 lb

## 2011-09-14 DIAGNOSIS — Z0000 Encounter for general adult medical examination without abnormal findings: Secondary | ICD-10-CM

## 2011-09-14 DIAGNOSIS — Z124 Encounter for screening for malignant neoplasm of cervix: Secondary | ICD-10-CM

## 2011-09-14 DIAGNOSIS — K625 Hemorrhage of anus and rectum: Secondary | ICD-10-CM

## 2011-09-14 DIAGNOSIS — M25519 Pain in unspecified shoulder: Secondary | ICD-10-CM

## 2011-09-14 LAB — POCT URINALYSIS DIPSTICK
Bilirubin, UA: NEGATIVE
Ketones, UA: NEGATIVE
Protein, UA: NEGATIVE
Spec Grav, UA: >=1.03
pH, UA: 5

## 2011-09-14 LAB — POCT CBC
Granulocyte percent: 68 %G (ref 37–80)
HCT, POC: 36.4 % — AB (ref 37.7–47.9)
MID (cbc): 0.4 (ref 0–0.9)
MPV: 8.7 fL (ref 0–99.8)
POC Granulocyte: 4.9 (ref 2–6.9)
POC LYMPH PERCENT: 25.8 %L (ref 10–50)
POC MID %: 6.2 %M (ref 0–12)
Platelet Count, POC: 276 10*3/uL (ref 142–424)
RDW, POC: 13.9 %

## 2011-09-14 LAB — COMPREHENSIVE METABOLIC PANEL
ALT: 14 U/L (ref 0–35)
AST: 22 U/L (ref 0–37)
Albumin: 4.4 g/dL (ref 3.5–5.2)
Alkaline Phosphatase: 37 U/L — ABNORMAL LOW (ref 39–117)
Potassium: 4.4 mEq/L (ref 3.5–5.3)
Sodium: 141 mEq/L (ref 135–145)
Total Protein: 7.2 g/dL (ref 6.0–8.3)

## 2011-09-14 LAB — LIPID PANEL
Cholesterol: 155 mg/dL (ref 0–200)
HDL: 76 mg/dL (ref >39)
Total CHOL/HDL Ratio: 2 Ratio
VLDL: 11 mg/dL (ref 0–40)

## 2011-09-14 LAB — POCT SEDIMENTATION RATE: POCT SED RATE: 20 mm/hr (ref 0–22)

## 2011-09-14 MED ORDER — TRAMADOL HCL 50 MG PO TABS: 50.0000 mg | ORAL_TABLET | Freq: Three times a day (TID) | ORAL | Status: AC | PRN

## 2011-09-14 NOTE — Progress Notes (Signed)
Patient Name: Kim Irwin Date of Birth: April 12, 1966 Medical Record Number: [id number] Gender: female Date of Encounter: 09/14/2011  History of Present Illness:  Kim Irwin is a 46 y.o. very pleasant female patient who presents with the following:  Annual exam.  Pap last year- looked fine Last mammo 3/12- has scheduled for next month Last tetanus about 2009 Married, one child [employer] operational [employer] Exercises regularly by walking No tobacco, some social etoh Still has occasional menses but is s/p tubal and endometrial ablation  Concerns 1. Right shoulder pain for the last couple of months.  Treated early feb with po prednisone.  Still quite painful and has limited ROM 2. Constipation and blood on stool every couple of days for the last 2 months.  Notes some decrease in size of her stool.  Had a colonoscopy in 2005 which was normal at [facility]- done due to concern of abnormal rectal exam by her GYN.    Patient Active Problem List  Diagnoses  . Health care maintenance   Past Medical History  Diagnosis Date  . Hearing loss    Past Surgical History  Procedure Date  . Tubal ligation   . Endometrial ablation    History  Substance Use Topics  . Smoking status: Former Smoker -- 0.2 packs/day for 15 years    Types: Cigarettes    Quit date: 07/18/1996  . Smokeless tobacco: Never Used  . Alcohol Use: Yes   Family History  Problem Relation Age of Onset  . Hyperlipidemia Father   . Asthma Father    No Known Allergies  Medication list has been reviewed and updated.  Review of Systems: As per HPI- otherwise negative. Also see "pink sheet"  Physical Examination: Filed Vitals:   09/14/11 0804  BP: 105/64  Pulse: 78  Temp: 98.1 F (36.7 C)  Resp: 16  Height: 5' 7.5" (1.715 m)  Weight: 142 lb (64.411 kg)    Body mass index is 21.91 kg/(m^2).   Wt Readings from Last 3 Encounters:  09/14/11 142 lb (64.411 kg)  08/20/11 142 lb 9.6 oz (64.683 kg)     GEN: well developed, well nourished, no acute distress Eyes: conjunctiva and lids normal, PERRLA, EOMI ENT: TM clear, nares clear, oral exam WNL Neck: supple, no lymphadenopathy, no thyromegaly, no JVD Pulm: clear to auscultation and percussion, respiratory effort normal CV: regular rate and rhythm, S1-S2, no murmur, rub or gallop, no bruits Chest: no scars, masses, no lumps BREAST: no lumps, no axillary LAD, no nipple discharge GI: soft, non-tender; no hepatosplenomegaly, masses; active bowel sounds all quadrants GU: Normal external female genitalia. Cervix appears intact without lesions or irritation. Vaginal canal normal without ulceration or lesion. Cervix NT to exam. Ovaries neither enlarged nor tender. Rectal exam normal, tiny skin tag at 12:00 Lymph: no cervical, axillary or inguinal adenopathy MSK: gait normal, muscle tone and strength WNL.  Right shoulder with decreased ROM and significant tenderness especially with abduction and external/ internal rotation.   SKIN: clear, good turgor, color WNL, no rashes, lesions, or ulcerations Neuro: normal mental status, normal strength, sensation, and motion Psych: alert; oriented to person, place and time, normally interactive and not anxious or depressed in appearance.  Results for orders placed in visit on 09/14/11  POCT CBC      Component Value Range   WBC 7.2  4.6 - 10.2 (K/uL)   Lymph, poc 1.9  0.6 - 3.4    POC LYMPH PERCENT 25.8  10 - 50 (%L)  MID (cbc) 0.4  0 - 0.9    POC MID % 6.2  0 - 12 (%M)   POC Granulocyte 4.9  2 - 6.9    Granulocyte percent 68.0  37 - 80 (%G)   RBC 4.07  4.04 - 5.48 (M/uL)   Hemoglobin 11.6 (*) 12.2 - 16.2 (g/dL)   HCT, POC 14.7 (*) 82.9 - 47.9 (%)   MCV 89.5  80 - 97 (fL)   MCH, POC 28.5  27 - 31.2 (pg)   MCHC 31.9  31.8 - 35.4 (g/dL)   RDW, POC 56.2     Platelet Count, POC 276  142 - 424 (K/uL)   MPV 8.7  0 - 99.8 (fL)  POCT URINALYSIS DIPSTICK      Component Value Range   Color, UA yellow      Clarity, UA clear     Glucose, UA negative     Bilirubin, UA negative     Ketones, UA negative     Spec Grav, UA >=1.030     Blood, UA negative     pH, UA 5.0     Protein, UA negative     Urobilinogen, UA 0.2     Nitrite, UA negative     Leukocytes, UA Negative      Assessment and Plan: 1. Annual physical exam  POCT CBC, Comprehensive metabolic panel, TSH, POCT urinalysis dipstick, Lipid panel, Pap IG w/ reflex to HPV when ASC-U  2. Rectal bleeding  POCT CBC  3. Shoulder pain  Ambulatory referral to Orthopedic Surgery, POCT SEDIMENTATION RATE, traMADol (ULTRAM) 50 MG tablet  4. Papanicolaou smear for cervical cancer screening  Pap IG w/ reflex to HPV when ASC-U   Shoulder pain; has not resolved with conservative therapy.  Pt has tried tramadol from her father which did help- prescribed a limited supply for her.  Also will refer to ortho for an evaluation suspect rotator cuff tendonitis Rectal bleeding and constipation; I am concerned about the decrease in stool caliper.  She will call [facility] to schedule an appointment for evaluation- let us know if any difficulty Patient continues to have complaint of persistent generalized body pains especially in the morning- do sed rate.

## 2011-09-15 ENCOUNTER — Encounter: Payer: Self-pay | Admitting: Family Medicine

## 2011-09-16 LAB — PAP IG W/ RFLX HPV ASCU

## 2011-09-21 ENCOUNTER — Telehealth: Payer: Self-pay

## 2011-09-21 NOTE — Telephone Encounter (Signed)
PATIENT WOULD LIKE DR. COPLAND TO CALL HER BACK IN REGARDS TO HER PAP LAB RESULTS.

## 2011-09-21 NOTE — Telephone Encounter (Signed)
Spoke with patient and discussed parakeratosis with patient.  No known exposure to HPV.  She does not have a regular menses secondary to an ablation several years ago.  Advised to RTC if pain or abnormal vaginal bleeding occurs, otherwise she may wish to have a repeat pap in 1 year.  Pt agrees.

## 2011-09-21 NOTE — Telephone Encounter (Signed)
Pt is concerned about lab results she rec'd. Dr Patsy Lager says that pap is ok, but in the notes from the lab it states there are abnormalities in the cells and may need to be followed up on. Please advise. Pt very concerned.

## 2011-09-25 ENCOUNTER — Ambulatory Visit (INDEPENDENT_AMBULATORY_CARE_PROVIDER_SITE_OTHER): Payer: BC Managed Care – PPO | Admitting: Emergency Medicine

## 2011-09-25 VITALS — BP 132/90 | HR 73 | Temp 98.7°F | Resp 16 | Ht 67.5 in | Wt 142.0 lb

## 2011-09-25 DIAGNOSIS — K5900 Constipation, unspecified: Secondary | ICD-10-CM

## 2011-09-25 DIAGNOSIS — K625 Hemorrhage of anus and rectum: Secondary | ICD-10-CM

## 2011-09-25 LAB — POCT CBC
Hemoglobin: 12.2 g/dL (ref 12.2–16.2)
Lymph, poc: 1.9 (ref 0.6–3.4)
MCH, POC: 28.8 pg (ref 27–31.2)
MCHC: 32.5 g/dL (ref 31.8–35.4)
MID (cbc): 0.4 (ref 0–0.9)
MPV: 8.7 fL (ref 0–99.8)
POC Granulocyte: 5 (ref 2–6.9)
POC MID %: 6.1 %M (ref 0–12)
Platelet Count, POC: 265 10*3/uL (ref 142–424)

## 2011-09-25 MED ORDER — HYDROCORTISONE ACETATE 25 MG RE SUPP: 25.0000 mg | Freq: Two times a day (BID) | RECTAL | Status: AC

## 2011-09-25 NOTE — Patient Instructions (Signed)

## 2011-09-25 NOTE — Progress Notes (Signed)
  Subjective:    Patient ID: Kim Irwin, female    DOB: 09-Sep-1965, 46 y.o.   MRN: [id number]  HPI patient enters with a chief complaint of rectal bleeding. She states when she went to the bathroom this morning and had a bowel movement she saw a large bright red blood in the water. She also found bright red blood when she wiped herself. She has a history of chronic constipation. She has no family history of any chronic: Polyps or colitis.    Review of Systems  Constitutional: Negative.   HENT: Negative.   Eyes: Negative.   Cardiovascular: Negative.   Gastrointestinal: Positive for constipation, blood in stool and anal bleeding.  Genitourinary: Negative.   Musculoskeletal: Negative.        Objective:   Physical Exam  Constitutional: She appears well-developed and well-nourished.  Eyes: Pupils are equal, round, and reactive to light.  Neck: Normal range of motion. Neck supple. No JVD present. No tracheal deviation present. No thyromegaly present.  Cardiovascular: Normal rate and regular rhythm.   Pulmonary/Chest: No respiratory distress. She has no wheezes. She has no rales. She exhibits no tenderness.  Abdominal: There is tenderness. There is no rebound and no guarding.       There is mild discomfort left side of the abdomen. There is palpable stool in the sigmoid colon. No masses were felt on rectal exam.  Genitourinary: Guaiac positive stool.          Assessment & Plan:   Assessment is constipation with heme positive stool. Episode of rectal bleeding this morning. We'll treat with Anusol-HC suppositories and start her on regimen for constipation. GI referral made to evaluate rectal bleeding. CBC done in the office was normal

## 2011-09-26 ENCOUNTER — Telehealth: Payer: Self-pay | Admitting: Gastroenterology

## 2011-09-26 NOTE — Telephone Encounter (Signed)
Patient was seen yesterday in the Urgent care for left sided pain and rectal bleeding .  She will come in and see [name] RNP at 11:00 tomorrow.

## 2011-09-27 ENCOUNTER — Ambulatory Visit (INDEPENDENT_AMBULATORY_CARE_PROVIDER_SITE_OTHER): Payer: BC Managed Care – PPO | Admitting: Nurse Practitioner

## 2011-09-27 ENCOUNTER — Encounter: Payer: Self-pay | Admitting: Nurse Practitioner

## 2011-09-27 DIAGNOSIS — K648 Other hemorrhoids: Secondary | ICD-10-CM

## 2011-09-27 DIAGNOSIS — K5900 Constipation, unspecified: Secondary | ICD-10-CM

## 2011-09-27 MED ORDER — LINACLOTIDE 145 MCG PO CAPS: 1.0000 | ORAL_CAPSULE | Freq: Every day | ORAL | Status: DC

## 2011-09-27 NOTE — Patient Instructions (Signed)
We sent a refill for Linzess to [facility] [address]. Continue the Anusol HC Suppositories for 10 days, use 1 suppository at  Bedtime. Make an appointment with Dr. Juanda Chance for 3 -4 weeks out.

## 2011-09-27 NOTE — Progress Notes (Signed)
09/27/2011 Kim Irwin [id number] [birth year]   HISTORY OF PRESENT ILLNESS: Patient is a 46 year old female who had a colonoscopy by  Dr. Russella Dar in 2005 for of constipation, abdominal pain and bloating. The exam was normal except for internal hemorrhoids. Patient presents today for evaluation of painless rectal bleeding. A couple of days ago she passed a large amount of bright red blood with a bowel movement .the patient was subsequently evaluated by her primary care physician who gave her a prescription for Anusol HC suppositories and obtained labs including a CBC and ESR. White blood cell count was normal as was her hemoglobin and MCV. ESR was normal at 15. Patient has tried MiraLax and stool softeners in the past but did not find them helpful She uses suppositories and takes ex-lax as needed.    Past Medical History  Diagnosis Date  . Hearing loss   . Urinary tract infection   . Pneumonia    Past Surgical History  Procedure Date  . Tubal ligation   . Endometrial ablation     reports that she quit smoking about 15 years ago. Her smoking use included Cigarettes. She has a 3 pack-year smoking history. She has never used smokeless tobacco. She reports that she drinks alcohol. She reports that she does not use illicit drugs. family history includes Asthma in her father and Hyperlipidemia in her father. No Known Allergies    Outpatient Encounter Prescriptions as of 09/27/2011  Medication Sig Dispense Refill  . hydrocortisone (ANUSOL-HC) 25 MG suppository Place 1 suppository (25 mg total) rectally 2 (two) times daily.  12 suppository  2     REVIEW OF SYSTEMS  : Positive for arthritis, back pain, vision changes, night sweats, and sleeping problems. All other systems reviewed and negative except where noted in the History of Present Illness.   PHYSICAL EXAM: BP 98/68  Pulse 72  Ht 5\' 8"  (1.727 m)  Wt 143 lb 12.8 oz (65.227 kg)  BMI 21.86 kg/m2  SpO2 98%  LMP 06/29/2011 General:  Well developed white female in no acute distress Head: Normocephalic and atraumatic Eyes:  sclerae anicteric,conjunctive pink. Ears: Normal auditory acuity Mouth: No deformity or lesions Neck: Supple, no masses.  Lungs: Clear throughout to auscultation Heart: Regular rate and rhythm; no murmurs heard Abdomen: Soft, non distended, nontender. No masses or hepatomegaly noted. Normal Bowel sounds Rectal: Two tiny protruding hemorrhoids. A few mildly inflamed internal hemorrhoids on anoscopy Musculoskeletal: Symmetrical with no gross deformities  Skin: No lesions on visible extremities Extremities: No edema or deformities noted Neurological: Alert oriented, grossly nonfocal Cervical Nodes:  No significant cervical adenopathy Psychological:  Alert and cooperative. Normal mood and affect  ASSESSMENT AND PLAN;  1. Constipation refractory to stool softeners and daily Miralax. We discussed increasing MiraLax to twice daily versus trying Linzess and at this point patient would like to try the Linzess, samples were given.  2. internal hemorrhoids with bleeding. Recommend patient continue Anusol HC suppositories nightly for ten days. Symptoms should resolve as long as we can correct the constipation. She will return to clinic in 3-4 weeks for reevaluation.

## 2011-09-28 ENCOUNTER — Encounter: Payer: Self-pay | Admitting: Nurse Practitioner

## 2011-09-28 DIAGNOSIS — K648 Other hemorrhoids: Secondary | ICD-10-CM | POA: Insufficient documentation

## 2011-09-28 DIAGNOSIS — K5900 Constipation, unspecified: Secondary | ICD-10-CM | POA: Insufficient documentation

## 2011-09-29 NOTE — Progress Notes (Signed)
I agree with the plan oultined in this note.  Not sure if she should be followed by Dr. Juanda Chance (as in patient instructions) or Dr. Russella Dar (as in HPI).

## 2011-10-04 ENCOUNTER — Telehealth: Payer: Self-pay | Admitting: Nurse Practitioner

## 2011-10-04 NOTE — Telephone Encounter (Signed)
Kim Irwin, ask her to add Miralax 1-2 times a day and see how that works. Make sure she is getting plenty of fiber as well. Thanks

## 2011-10-04 NOTE — Telephone Encounter (Signed)
Spoke with patient and gave her Paula Guenther, NP recommendations. 

## 2011-10-04 NOTE — Telephone Encounter (Signed)
Spoke with patient and she wants to let [name], NP know the Linzess is not working for her. States it helped for the first few days then stopped working. States she did use a Glycerin suppository last night to have a bowel movement. Please,advise.

## 2011-10-05 ENCOUNTER — Ambulatory Visit
Admission: RE | Admit: 2011-10-05 | Discharge: 2011-10-05 | Disposition: A | Discharge status: Home / Self Care | Payer: BC Managed Care – PPO | Source: Ambulatory Visit | Attending: Family Medicine | Admitting: Family Medicine

## 2011-10-05 DIAGNOSIS — Z1231 Encounter for screening mammogram for malignant neoplasm of breast: Secondary | ICD-10-CM

## 2011-10-11 ENCOUNTER — Telehealth: Payer: Self-pay | Admitting: *Deleted

## 2011-10-11 NOTE — Telephone Encounter (Signed)
Message copied by Daphine Deutscher on Tue Oct 11, 2011  2:59 PM ------      Message from: Meredith Pel      Created: Tue Oct 11, 2011 11:30 AM       Rene Kocher, would you please call patient and see if here constipation is better? Thanks.

## 2011-10-11 NOTE — Telephone Encounter (Signed)
Kim Irwin, would you please call patient and see if here constipation is better? Thanks.[name], NP Patient states she is doing some better. She usually has to use glycerin suppository or Ex Lax to help. Linzess and Miralax does not usually get it.

## 2011-10-18 ENCOUNTER — Ambulatory Visit: Payer: BC Managed Care – PPO | Admitting: Internal Medicine

## 2011-10-31 ENCOUNTER — Encounter: Payer: Self-pay | Admitting: Gastroenterology

## 2011-10-31 ENCOUNTER — Ambulatory Visit (INDEPENDENT_AMBULATORY_CARE_PROVIDER_SITE_OTHER): Payer: BC Managed Care – PPO | Admitting: Gastroenterology

## 2011-10-31 VITALS — BP 120/70 | HR 72 | Ht 68.0 in | Wt 144.0 lb

## 2011-10-31 DIAGNOSIS — K921 Melena: Secondary | ICD-10-CM

## 2011-10-31 DIAGNOSIS — K5900 Constipation, unspecified: Secondary | ICD-10-CM

## 2011-10-31 MED ORDER — MOVIPREP 100 G PO SOLR: 1.0000 | Freq: Once | ORAL | Status: DC

## 2011-10-31 NOTE — Progress Notes (Signed)
History of Present Illness: This is a 46 year old female who was recently seen by [name], NP. She has chronic constipation and worsening rectal bleeding. Internal hemorrhoids were noted on anoscopy. Colonoscopy in 2005 was normal. Patient was treated with Linzess which was not effective. She has not had adequate results when using MiraLax on a daily basis in the past. She states she'll often have results if she uses 2 or 3 Ex-Lax. She notes left lower quadrant fullness. Denies weight loss, diarrhea, change in stool caliber, melena, nausea, vomiting, dysphagia, reflux symptoms, chest pain.  Current Medications, Allergies, Past Medical History, Past Surgical History, Family History and Social History were reviewed in [employer] record.  Physical Exam: General: Well developed , well nourished, no acute distress Head: Normocephalic and atraumatic Eyes:  sclerae anicteric, EOMI Ears: Normal auditory acuity Mouth: No deformity or lesions Lungs: Clear throughout to auscultation Heart: Regular rate and rhythm; no murmurs, rubs or bruits Abdomen: Soft, non tender and non distended. Minimal left lower quadrant fullness. No masses, hepatosplenomegaly or hernias noted. Normal Bowel sounds Rectal: Recent exam showed internal hemorrhoids on anoscopy, deferred to colonoscopy Musculoskeletal: Symmetrical with no gross deformities  Pulses:  Normal pulses noted Extremities: No clubbing, cyanosis, edema or deformities noted Neurological: Alert oriented x 4, grossly nonfocal Psychological:  Alert and cooperative. Normal mood and affect  Assessment and Recommendations:  1. Constipation. Increase fluid and fiber intake. DC Linzess. Begin MiraLax 3 times daily. Ex-lax when necessary. Consider a trial of Amitiza.  2. Rectal bleeding. Likely secondary to internal hemorrhoids. Rule out colorectal neoplasms. The risks, benefits, and alternatives to colonoscopy with possible biopsy,  possible destruction of internal hemorrhoids and possible polypectomy were discussed with the patient and they consent to proceed.

## 2011-10-31 NOTE — Patient Instructions (Signed)
You have been scheduled for a colonoscopy with propofol. Please follow written instructions given to you at your visit today.  Please pick up your prep kit at the pharmacy within the next 1-3 days. cc: Abbe Amsterdam, MD

## 2011-11-01 ENCOUNTER — Encounter: Payer: Self-pay | Admitting: Gastroenterology

## 2011-11-29 ENCOUNTER — Encounter: Payer: BC Managed Care – PPO | Admitting: Gastroenterology

## 2012-01-02 ENCOUNTER — Encounter (HOSPITAL_COMMUNITY): Payer: Self-pay | Admitting: Cardiology

## 2012-01-02 ENCOUNTER — Emergency Department (HOSPITAL_COMMUNITY)
Admission: EM | Admit: 2012-01-02 | Discharge: 2012-01-02 | Disposition: A | Discharge status: Home / Self Care | Payer: BC Managed Care – PPO | Source: Home / Self Care | Attending: Emergency Medicine | Admitting: Emergency Medicine

## 2012-01-02 DIAGNOSIS — N83209 Unspecified ovarian cyst, unspecified side: Secondary | ICD-10-CM

## 2012-01-02 LAB — CBC
Hemoglobin: 12.7 g/dL (ref 12.0–15.0)
Platelets: 223 10*3/uL (ref 150–400)
RBC: 4.36 MIL/uL (ref 3.87–5.11)
WBC: 8.4 10*3/uL (ref 4.0–10.5)

## 2012-01-02 LAB — POCT URINALYSIS DIP (DEVICE)
Leukocytes, UA: NEGATIVE
Protein, ur: NEGATIVE mg/dL
Specific Gravity, Urine: 1.025 (ref 1.005–1.030)

## 2012-01-02 LAB — WET PREP, GENITAL
Trich, Wet Prep: NONE SEEN
Yeast Wet Prep HPF POC: NONE SEEN

## 2012-01-02 LAB — POCT PREGNANCY, URINE: Preg Test, Ur: NEGATIVE

## 2012-01-02 LAB — DIFFERENTIAL
Eosinophils Absolute: 0.1 10*3/uL (ref 0.0–0.7)
Lymphocytes Relative: 24 % (ref 12–46)
Lymphs Abs: 2 10*3/uL (ref 0.7–4.0)
Monocytes Relative: 8 % (ref 3–12)
Neutro Abs: 5.7 10*3/uL (ref 1.7–7.7)
Neutrophils Relative %: 67 % (ref 43–77)

## 2012-01-02 MED ORDER — NORETHINDRONE-ETH ESTRADIOL 1-35 MG-MCG PO TABS: 1.0000 | ORAL_TABLET | Freq: Every day | ORAL | Status: DC

## 2012-01-02 MED ORDER — HYDROCODONE-ACETAMINOPHEN 5-325 MG PO TABS
ORAL_TABLET | ORAL | Status: AC
Start: 2012-01-02 — End: 2012-01-12

## 2012-01-02 NOTE — ED Provider Notes (Signed)
Chief Complaint  Patient presents with  . Abdominal Pain  . Back Pain    History of Present Illness:   Kim Irwin is a 46 year old female who has had a four-day history of right lower quadrant pain radiating to her back. The pain began gradually, it's stabbing in quality and rated 7/10 in intensity. The pain is continuous, worse if she walks, stands, is on her feet, or goes over bumps in her car and better if she rests or with a heating pad. She's felt somewhat nauseated but has not vomited. She's felt cold and clammy, but has not had a fever, although she has had chills. She has had ongoing problems with constipation but this is no better or no worse than usual. She had had some mild hematochezia about a month ago and this was diagnosed as being due to hemorrhoids. She notes some urinary frequency but no dysuria, urgency, or hematuria. Her last menstrual period was years ago. She's had a endometrial ablation. Also had bilateral tubal ligation. She had pain like this before about 6 months ago. She saw her primary care physician. No pelvic exam was done and she was diagnosed with ovarian cysts, given an injection, and this then got better.  Review of Systems:  Other than noted above, the patient denies any of the following symptoms: Constitutional:  No fever, chills, fatigue, weight loss or anorexia. Lungs:  No cough or shortness of breath. Heart:  No chest pain, palpitations, syncope or edema.  No cardiac history. Abdomen:  No nausea, vomiting, hematememesis, melena, diarrhea, or hematochezia. GU:  No dysuria, frequency, urgency, or hematuria. Gyn:  No vaginal discharge, itching, abnormal bleeding, dyspareunia, or pelvic pain. Skin:  No rash or itching.  PMFSH:  Past medical history, family history, social history, meds, and allergies were reviewed.  No prior abdominal surgeries, past history of GI problems, STDs or GYN problems.  No history of aspirin or NSAID use.  No excessive alcohol  intake.  Physical Exam:   Vital signs:  BP 109/72  Pulse 78  Temp 98.8 F (37.1 C) (Oral)  Resp 20  SpO2 100%  LMP 08/07/2011 Gen:  Alert, oriented, in no distress. Lungs:  Breath sounds clear and equal bilaterally.  No wheezes, rales or rhonchi. Heart:  Regular rhythm.  No gallops or murmers.   Abdomen:  Abdomen was soft, flat, and nondistended. She has mild tenderness to palpation in the right lower quadrant, suprapubic area, and the left lower quadrant without guarding or rebound. Bowel sounds are normally active. There is no organomegaly or mass. Pelvic:  Normal external genitalia. Vaginal mucosa was normal as was the cervix. There was a tiny amount of white discharge. There was no cervical motion tenderness. Uterus was anterior, normal in size, shape, and nontender. No adnexal tenderness or mass on the left. She has a 2 cm ovarian mass on the right which is somewhat tender. Skin:  Clear, warm and dry.  No rash.  Labs:   Results for orders placed during the hospital encounter of 01/02/12  POCT URINALYSIS DIP (DEVICE)      Component Value Range   Glucose, UA NEGATIVE  NEGATIVE mg/dL   Bilirubin Urine NEGATIVE  NEGATIVE   Ketones, ur NEGATIVE  NEGATIVE mg/dL   Specific Gravity, Urine 1.025  1.005 - 1.030   Hgb urine dipstick TRACE (*) NEGATIVE   pH 6.0  5.0 - 8.0   Protein, ur NEGATIVE  NEGATIVE mg/dL   Urobilinogen, UA 0.2  0.0 - 1.0 mg/dL  Nitrite NEGATIVE  NEGATIVE   Leukocytes, UA NEGATIVE  NEGATIVE  POCT PREGNANCY, URINE      Component Value Range   Preg Test, Ur NEGATIVE  NEGATIVE  CBC      Component Value Range   WBC 8.4  4.0 - 10.5 K/uL   RBC 4.36  3.87 - 5.11 MIL/uL   Hemoglobin 12.7  12.0 - 15.0 g/dL   HCT 16.1  09.6 - 04.5 %   MCV 86.5  78.0 - 100.0 fL   MCH 29.1  26.0 - 34.0 pg   MCHC 33.7  30.0 - 36.0 g/dL   RDW 40.9  81.1 - 91.4 %   Platelets 223  150 - 400 K/uL  DIFFERENTIAL      Component Value Range   Neutrophils Relative 67  43 - 77 %   Neutro Abs  5.7  1.7 - 7.7 K/uL   Lymphocytes Relative 24  12 - 46 %   Lymphs Abs 2.0  0.7 - 4.0 K/uL   Monocytes Relative 8  3 - 12 %   Monocytes Absolute 0.7  0.1 - 1.0 K/uL   Eosinophils Relative 1  0 - 5 %   Eosinophils Absolute 0.1  0.0 - 0.7 K/uL   Basophils Relative 0  0 - 1 %   Basophils Absolute 0.0  0.0 - 0.1 K/uL  WET PREP, GENITAL      Component Value Range   Yeast Wet Prep HPF POC NONE SEEN  NONE SEEN   Trich, Wet Prep NONE SEEN  NONE SEEN   Clue Cells Wet Prep HPF POC FEW (*) NONE SEEN   WBC, Wet Prep HPF POC FEW (*) NONE SEEN     Assessment:  The encounter diagnosis was Ovarian cyst. Probable ovarian cyst, the small amount of discharge that she has right now does not want treatment even though I few clue cells were seen. She does not complaining of any discharge or odor.  Plan:   1.  The following meds were prescribed:   New Prescriptions   HYDROCODONE-ACETAMINOPHEN (NORCO) 5-325 MG PER TABLET    1 to 2 tabs every 4 to 6 hours as needed for pain.   NORETHINDRONE-ETHINYL ESTRADIOL 1/35 (ORTHO-NOVUM, NORTREL,CYCLAFEM) TABLET    Take 1 tablet by mouth daily.   2.  The patient was instructed in symptomatic care and handouts were given. 3.  The patient was told to return if becoming worse in any way, if no better in 3 or 4 days, and given some red flag symptoms that would indicate earlier return.  Follow up:  The patient was told to follow up told to followup with her primary care physician in about 2 weeks to confirm that the cyst is gone away completely.     Reuben Likes, MD 01/02/12 [phone]

## 2012-01-02 NOTE — Discharge Instructions (Signed)
Ovarian Cyst The ovaries are small organs that are on each side of the uterus. The ovaries are the organs that produce the female hormones, estrogen and progesterone. An ovarian cyst is a sac filled with fluid that can vary in its size. It is normal for a small cyst to form in women who are in the childbearing age and who have menstrual periods. This type of cyst is called a follicle cyst that becomes an ovulation cyst (corpus luteum cyst) after it produces the women's egg. It later goes away on its own if the woman does not become pregnant. There are other kinds of ovarian cysts that may cause problems and may need to be treated. The most serious problem is a cyst with cancer. It should be noted that menopausal women who have an ovarian cyst are at a higher risk of it being a cancer cyst. They should be evaluated very quickly, thoroughly and followed closely. This is especially true in menopausal women because of the high rate of ovarian cancer in women in menopause. CAUSES AND TYPES OF OVARIAN CYSTS:  FUNCTIONAL CYST: The follicle/corpus luteum cyst is a functional cyst that occurs every month during ovulation with the menstrual cycle. They go away with the next menstrual cycle if the woman does not get pregnant. Usually, there are no symptoms with a functional cyst.   ENDOMETRIOMA CYST: This cyst develops from the lining of the uterus tissue. This cyst gets in or on the ovary. It grows every month from the bleeding during the menstrual period. It is also called a "chocolate cyst" because it becomes filled with blood that turns brown. This cyst can cause pain in the lower abdomen during intercourse and with your menstrual period.   CYSTADENOMA CYST: This cyst develops from the cells on the outside of the ovary. They usually are not cancerous. They can get very big and cause lower abdomen pain and pain with intercourse. This type of cyst can twist on itself, cut off its blood supply and cause severe pain.  It also can easily rupture and cause a lot of pain.   DERMOID CYST: This type of cyst is sometimes found in both ovaries. They are found to have different kinds of body tissue in the cyst. The tissue includes skin, teeth, hair, and/or cartilage. They usually do not have symptoms unless they get very big. Dermoid cysts are rarely cancerous.   POLYCYSTIC OVARY: This is a rare condition with hormone problems that produces many small cysts on both ovaries. The cysts are follicle-like cysts that never produce an egg and become a corpus luteum. It can cause an increase in body weight, infertility, acne, increase in body and facial hair and lack of menstrual periods or rare menstrual periods. Many women with this problem develop type 2 diabetes. The exact cause of this problem is unknown. A polycystic ovary is rarely cancerous.   THECA LUTEIN CYST: Occurs when too much hormone (human chorionic gonadotropin) is produced and over-stimulates the ovaries to produce an egg. They are frequently seen when doctors stimulate the ovaries for invitro-fertilization (test tube babies).   LUTEOMA CYST: This cyst is seen during pregnancy. Rarely it can cause an obstruction to the birth canal during labor and delivery. They usually go away after delivery.  SYMPTOMS   Pelvic pain or pressure.   Pain during sexual intercourse.   Increasing girth (swelling) of the abdomen.   Abnormal menstrual periods.   Increasing pain with menstrual periods.   You stop having   menstrual periods and you are not pregnant.  DIAGNOSIS  The diagnosis can be made during:  Routine or annual pelvic examination (common).   Ultrasound.   X-ray of the pelvis.   CT Scan.   MRI.   Blood tests.  TREATMENT   Treatment may only be to follow the cyst monthly for 2 to 3 months with your caregiver. Many go away on their own, especially functional cysts.   May be aspirated (drained) with a long needle with ultrasound, or by laparoscopy  (inserting a tube into the pelvis through a small incision).   The whole cyst can be removed by laparoscopy.   Sometimes the cyst may need to be removed through an incision in the lower abdomen.   Hormone treatment is sometimes used to help dissolve certain cysts.   Birth control pills are sometimes used to help dissolve certain cysts.  HOME CARE INSTRUCTIONS  Follow your caregiver's advice regarding:  Medicine.   Follow up visits to evaluate and treat the cyst.   You may need to come back or make an appointment with another caregiver, to find the exact cause of your cyst, if your caregiver is not a gynecologist.   Get your yearly and recommended pelvic examinations and Pap tests.   Let your caregiver know if you have had an ovarian cyst in the past.  SEEK MEDICAL CARE IF:   Your periods are late, irregular, they stop, or are painful.   Your stomach (abdomen) or pelvic pain does not go away.   Your stomach becomes larger or swollen.   You have pressure on your bladder or trouble emptying your bladder completely.   You have painful sexual intercourse.   You have feelings of fullness, pressure, or discomfort in your stomach.   You lose weight for no apparent reason.   You feel generally ill.   You become constipated.   You lose your appetite.   You develop acne.   You have an increase in body and facial hair.   You are gaining weight, without changing your exercise and eating habits.   You think you are pregnant.  SEEK IMMEDIATE MEDICAL CARE IF:   You have increasing abdominal pain.   You feel sick to your stomach (nausea) and/or vomit.   You develop a fever that comes on suddenly.   You develop abdominal pain during a bowel movement.   Your menstrual periods become heavier than usual.  Document Released: 07/04/2005 Document Revised: 06/23/2011 Document Reviewed: 05/07/2009 ExitCare Patient Information 2012 [employer]. 

## 2012-01-02 NOTE — ED Notes (Signed)
Pt asking for something for nausea at time of discharge. Pt aware that Dr Lorenz Coaster in with another pt at this time. She waited a couple minutes then decided she was ready to go. Dr Lorenz Coaster informed of pt request. Order for Zofran 8mg  ODT disp # 20 take one tab every 8 hours as needed for nausea. Script called to [facility] at [address]. Pharmacy aware pt has another script to pick up. Attempt to notify pt phone identified her number message left.

## 2012-01-02 NOTE — ED Notes (Signed)
Pt report right side abdominal pain and lower back pain since Friday night. Pt reports history of ovaria cyst and states feels like she may have one at this time. Pt has had frequency of urination but no burning. Tolerating PO intake. Denies fever.Denies vaginal discharge.

## 2012-01-03 LAB — GC/CHLAMYDIA PROBE AMP, GENITAL
Chlamydia, DNA Probe: NEGATIVE
GC Probe Amp, Genital: NEGATIVE

## 2012-01-05 ENCOUNTER — Telehealth (HOSPITAL_COMMUNITY): Payer: Self-pay | Admitting: *Deleted

## 2012-01-05 NOTE — ED Notes (Signed)
Pt. called for her lab results.  Pt. verified x 2 and given results ( GC/Chlamydia neg., Wet prep: few clue cells, few WBC's). No further action needed. Vassie [place] 01/05/2012

## 2012-01-09 ENCOUNTER — Telehealth (HOSPITAL_COMMUNITY): Payer: Self-pay | Admitting: *Deleted

## 2012-01-09 NOTE — ED Notes (Signed)
Pt. called for her lab results.  She was not sure if she had already called for them. Pt. verified x 2 and given results. I told her I had given them to her on 6/20.  She said she is still having some discharge and odor. Get rechecked if no improvement. Vassie [place] 01/09/2012

## 2012-03-06 ENCOUNTER — Ambulatory Visit: Payer: BC Managed Care – PPO

## 2012-03-06 ENCOUNTER — Ambulatory Visit (INDEPENDENT_AMBULATORY_CARE_PROVIDER_SITE_OTHER): Payer: BC Managed Care – PPO | Admitting: Family Medicine

## 2012-03-06 VITALS — BP 122/74 | HR 84 | Temp 99.2°F | Resp 16 | Ht 67.5 in | Wt 143.4 lb

## 2012-03-06 DIAGNOSIS — M62838 Other muscle spasm: Secondary | ICD-10-CM

## 2012-03-06 DIAGNOSIS — M542 Cervicalgia: Secondary | ICD-10-CM

## 2012-03-06 MED ORDER — HYDROCODONE-ACETAMINOPHEN 5-325 MG PO TABS: 2.0000 | ORAL_TABLET | Freq: Four times a day (QID) | ORAL | Status: DC | PRN

## 2012-03-06 NOTE — Progress Notes (Signed)
  Subjective:    Patient ID: Kim Irwin, female    DOB: 1965-09-08, 46 y.o.   MRN: [id number]  HPI Kim Irwin is a 46 y.o. female Neck pain for about a month.  NKI.  Tx: ibuprofen, vicodin form other urgent care - [facility] - 2 weeks ago. On prednisone - for past 2 weeks - no relief. Vicodin - 1 every 6 hours. No fever.  No photo/phonophobia. Tingling in R hand 1 time. No weakness or recurrnece of parasthesias.  Flexeril at bedtime - but too sedated in the morning.  Using heating pad every day.   Hx of sciatica - has tens unit - tried this on neck multiple times - no relief.    No prior ortho eval for this.   SH:  Sales.  Quit smoking in 1998.   Review of Systems As above.     Objective:   Physical Exam  Constitutional: She is oriented to person, place, and time. She appears well-developed and well-nourished.  HENT:  Head: Normocephalic and atraumatic.  Pulmonary/Chest: Effort normal.  Musculoskeletal:       Cervical back: She exhibits decreased range of motion (pain and slight decreased lateral flexion and rotation bilaterally. ), pain and spasm.       Back:  Neurological: She is alert and oriented to person, place, and time. She has normal strength.  Reflex Scores:      Tricep reflexes are 2+ on the right side and 2+ on the left side.      Bicep reflexes are 2+ on the right side and 2+ on the left side.      Brachioradialis reflexes are 2+ on the right side and 2+ on the left side.   [facility] reading (PRIMARY) by  Dr. Neva Seat: C-spine: decreased lower lordosis, ? Osteophyte anterior to c3-4, and decreased disc space below C7.      Assessment & Plan:  Kim Irwin is a 46 y.o. female 1. Neck pain  DG Cervical Spine Complete, HYDROcodone-acetaminophen (NORCO/VICODIN) 5-325 MG per tablet, Ambulatory referral to Orthopedic Surgery  2. Muscle spasms of neck  Ambulatory referral to Orthopedic Surgery   Status post prednisone, and trial of  home tens unit - no weakness on exam. Refer to ortho for possible further imaging or trial of PT. Can increase vicodin to 2 every 6 hours as needed. rtc precautions.   Patient Instructions  We will refer you to an orthopaedic doctor for this pain. You can continue the prednisone as prescribed before.  You can take up to 2 of the hydrocodone pills every 6 hours as needed, and apply to heat to area. Flexeril as tolerated for muscle spasm.  Return to the clinic or go to the nearest emergency room if any of your symptoms worsen or new symptoms occur.

## 2012-03-06 NOTE — Patient Instructions (Signed)
We will refer you to an orthopaedic doctor for this pain. You can continue the prednisone as prescribed before.  You can take up to 2 of the hydrocodone pills every 6 hours as needed, and apply to heat to area. Flexeril as tolerated for muscle spasm.  Return to the clinic or go to the nearest emergency room if any of your symptoms worsen or new symptoms occur.

## 2012-09-05 ENCOUNTER — Other Ambulatory Visit: Payer: Self-pay | Admitting: Obstetrics and Gynecology

## 2012-09-17 ENCOUNTER — Other Ambulatory Visit (HOSPITAL_COMMUNITY)
Admission: RE | Admit: 2012-09-17 | Discharge: 2012-09-17 | Disposition: A | Discharge status: Home / Self Care | Payer: BC Managed Care – PPO | Source: Ambulatory Visit | Attending: Obstetrics and Gynecology | Admitting: Obstetrics and Gynecology

## 2012-09-17 ENCOUNTER — Other Ambulatory Visit: Payer: Self-pay | Admitting: Obstetrics and Gynecology

## 2012-09-17 DIAGNOSIS — Z1151 Encounter for screening for human papillomavirus (HPV): Secondary | ICD-10-CM | POA: Insufficient documentation

## 2012-09-17 DIAGNOSIS — Z01419 Encounter for gynecological examination (general) (routine) without abnormal findings: Secondary | ICD-10-CM | POA: Insufficient documentation

## 2012-09-25 ENCOUNTER — Ambulatory Visit (INDEPENDENT_AMBULATORY_CARE_PROVIDER_SITE_OTHER): Payer: BC Managed Care – PPO | Admitting: Family Medicine

## 2012-09-25 VITALS — BP 110/78 | HR 73 | Temp 98.5°F | Resp 16 | Ht 68.0 in | Wt 148.0 lb

## 2012-09-25 DIAGNOSIS — M65979 Unspecified synovitis and tenosynovitis, unspecified ankle and foot: Secondary | ICD-10-CM

## 2012-09-25 DIAGNOSIS — M659 Synovitis and tenosynovitis, unspecified: Secondary | ICD-10-CM

## 2012-09-25 MED ORDER — HYDROCODONE-ACETAMINOPHEN 5-300 MG PO TABS: 1.0000 | ORAL_TABLET | Freq: Four times a day (QID) | ORAL | Status: DC | PRN

## 2012-09-25 MED ORDER — METHYLPREDNISOLONE 4 MG PO KIT: PACK | ORAL | Status: DC

## 2012-09-25 NOTE — Progress Notes (Signed)
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

## 2012-09-25 NOTE — Patient Instructions (Addendum)
De Quervain's Disease  De Quervain's disease is a condition often seen in racquet sports where there is a soreness (inflammation) in the cord like structures (tendons) which attach muscle to bone on the thumb side of the wrist. There may be a tightening of the tissuesaround the tendons. This condition is often helped by giving up or modifying the activity which caused it. When conservative treatment does not help, surgery may be required. Conservative treatment could include changes in the activity which brought about the problem or made it worse. Anti-inflammatory medications and injections may be used to help decrease the inflammation and help with pain control. Your caregiver will help you determine which is best for you.  DIAGNOSIS   Often the diagnosis (learning what is wrong) can be made by examination. Sometimes x-rays are required.  HOME CARE INSTRUCTIONS    Apply ice to the sore area for 15 to 20 minutes, 3 to 4 times per day while awake. Put the ice in a plastic bag and place a towel between the bag of ice and your skin. This is especially helpful if it can be done after all activities involving the sore wrist.   Temporary splinting may help.   Only take over-the-counter or prescription medicines for pain, discomfort or fever as directed by your caregiver.  SEEK MEDICAL CARE IF:    Pain relief is not obtained with medications, or if you have increasing pain and seem to be getting worse rather than better.  MAKE SURE YOU:    Understand these instructions.   Will watch your condition.   Will get help right away if you are not doing well or get worse.  Document Released: 03/29/2001 Document Revised: 09/26/2011 Document Reviewed: 07/04/2005  ExitCare Patient Information 2013 [employer].

## 2012-10-09 ENCOUNTER — Ambulatory Visit
Admission: RE | Admit: 2012-10-09 | Discharge: 2012-10-09 | Disposition: A | Discharge status: Home / Self Care | Payer: BC Managed Care – PPO | Source: Ambulatory Visit | Attending: Obstetrics and Gynecology | Admitting: Obstetrics and Gynecology

## 2012-10-09 DIAGNOSIS — Z1231 Encounter for screening mammogram for malignant neoplasm of breast: Secondary | ICD-10-CM

## 2013-04-12 ENCOUNTER — Ambulatory Visit (INDEPENDENT_AMBULATORY_CARE_PROVIDER_SITE_OTHER): Payer: BC Managed Care – PPO | Admitting: Emergency Medicine

## 2013-04-12 VITALS — BP 120/80 | HR 77 | Temp 97.8°F | Resp 18 | Ht 67.5 in | Wt 142.0 lb

## 2013-04-12 DIAGNOSIS — S335XXA Sprain of ligaments of lumbar spine, initial encounter: Secondary | ICD-10-CM

## 2013-04-12 MED ORDER — HYDROCODONE-ACETAMINOPHEN 5-325 MG PO TABS: 1.0000 | ORAL_TABLET | ORAL | Status: DC | PRN

## 2013-04-12 MED ORDER — CYCLOBENZAPRINE HCL 10 MG PO TABS: 10.0000 mg | ORAL_TABLET | Freq: Three times a day (TID) | ORAL | Status: DC | PRN

## 2013-04-12 MED ORDER — NAPROXEN SODIUM 550 MG PO TABS: 550.0000 mg | ORAL_TABLET | Freq: Two times a day (BID) | ORAL | Status: AC

## 2013-04-12 NOTE — Progress Notes (Signed)
[facility] [address] [place] [address] [phone]  Date:  04/12/2013   Name:  Kim Irwin   DOB:  27-Aug-1965   MRN:  [id number]  PCP:  Abbe Amsterdam, MD    Chief Complaint: Back Pain   History of Present Illness:  Kim Irwin is a 47 y.o. very pleasant female patient who presents with the following:  She moved a queen mattress last night by herself and injured her right low back in the process.  No history of prior back injury.  Has pain in to her right buttock and tingling into her right foot.  Feels "week" in leg but has not "given out" she says.  No improvement with over the counter medications or other home remedies. Denies other complaint or health concern today.   Patient Active Problem List   Diagnosis Date Noted  . Constipation 09/28/2011  . Bleeding internal hemorrhoids 09/28/2011  . Health care maintenance 08/20/2011    Past Medical History  Diagnosis Date  . Hearing loss   . Urinary tract infection   . Pneumonia   . Arthritis     Past Surgical History  Procedure Laterality Date  . Tubal ligation    . Endometrial ablation      History  Substance Use Topics  . Smoking status: Former Smoker -- 0.20 packs/day for 15 years    Types: Cigarettes    Quit date: 07/18/1996  . Smokeless tobacco: Never Used  . Alcohol Use: Yes     Comment: daily wine    Family History  Problem Relation Age of Onset  . Hyperlipidemia Father   . Asthma Father   . Other Mother     Pace maker    No Known Allergies  Medication list has been reviewed and updated.  Current Outpatient Prescriptions on File Prior to Visit  Medication Sig Dispense Refill  . cyclobenzaprine (FLEXERIL) 10 MG tablet Take 10 mg by mouth 3 (three) times daily as needed.      . docusate sodium (COLACE) 100 MG capsule Take 100 mg by mouth as needed.      . Hydrocodone-Acetaminophen (VICODIN) 5-300 MG TABS Take 1 each by mouth 4 (four) times daily as  needed.  20 each  0  . Linaclotide (LINZESS) 145 MCG CAPS Take 1 capsule by mouth daily.  30 capsule  0  . methylPREDNISolone (MEDROL, PAK,) 4 MG tablet follow package directions  21 tablet  0  . MOVIPREP 100 G SOLR Take 1 kit (100 g total) by mouth once.  1 kit  0  . norethindrone-ethinyl estradiol 1/35 (ORTHO-NOVUM, NORTREL,CYCLAFEM) tablet Take 1 tablet by mouth daily.  1 Package  0  . predniSONE (DELTASONE) 10 MG tablet Take 10 mg by mouth 2 (two) times daily.      . traMADol (ULTRAM) 50 MG tablet Take 50 mg by mouth every 6 (six) hours as needed.       No current facility-administered medications on file prior to visit.    Review of Systems:  As per HPI, otherwise negative.    Physical Examination: Filed Vitals:   04/12/13 0917  BP: 120/80  Pulse: 77  Temp: 97.8 F (36.6 C)  Resp: 18   Filed Vitals:   04/12/13 0917  Height: 5' 7.5" (1.715 m)  Weight: 142 lb (64.411 kg)   Body mass index is 21.9 kg/(m^2). Ideal Body Weight: Weight in (lb) to have BMI = 25: 161.7   GEN: WDWN, moderate  distress, Non-toxic, Alert & Oriented x 3 HEENT: Atraumatic, Normocephalic.  Ears and Nose: No external deformity. EXTR: No clubbing/cyanosis/edema NEURO: antalgic gait.  PSYCH: Normally interactive. Conversant. Not depressed or anxious appearing.  Calm demeanor.  BACK:  Marked right lumbar paraspinous and sciatic notch tenderness.  Neuro grossly intact; motor, cerebellar and sensory.  DTR's symmetrical  Assessment and Plan: Lumbar strain Follow up in one week. Anaprox Flexeril vicodin Ice   Signed,  Phillips Odor, MD

## 2013-04-12 NOTE — Patient Instructions (Addendum)

## 2013-04-18 ENCOUNTER — Other Ambulatory Visit: Payer: Self-pay | Admitting: Dermatology

## 2013-08-13 ENCOUNTER — Encounter: Payer: Self-pay | Admitting: Gastroenterology

## 2013-09-16 ENCOUNTER — Other Ambulatory Visit: Payer: Self-pay

## 2013-09-16 DIAGNOSIS — Z1231 Encounter for screening mammogram for malignant neoplasm of breast: Secondary | ICD-10-CM

## 2013-10-02 ENCOUNTER — Other Ambulatory Visit (HOSPITAL_COMMUNITY)
Admission: RE | Admit: 2013-10-02 | Discharge: 2013-10-02 | Disposition: A | Discharge status: Home / Self Care | Payer: BC Managed Care – PPO | Source: Ambulatory Visit | Attending: Obstetrics and Gynecology | Admitting: Obstetrics and Gynecology

## 2013-10-02 ENCOUNTER — Other Ambulatory Visit: Payer: Self-pay | Admitting: Obstetrics and Gynecology

## 2013-10-02 DIAGNOSIS — Z01419 Encounter for gynecological examination (general) (routine) without abnormal findings: Secondary | ICD-10-CM | POA: Insufficient documentation

## 2013-10-10 ENCOUNTER — Ambulatory Visit: Payer: BC Managed Care – PPO

## 2013-10-25 ENCOUNTER — Ambulatory Visit: Payer: BC Managed Care – PPO

## 2013-11-08 ENCOUNTER — Encounter (INDEPENDENT_AMBULATORY_CARE_PROVIDER_SITE_OTHER): Payer: Self-pay

## 2013-11-08 ENCOUNTER — Ambulatory Visit
Admission: RE | Admit: 2013-11-08 | Discharge: 2013-11-08 | Disposition: A | Discharge status: Home / Self Care | Payer: Self-pay | Source: Ambulatory Visit

## 2013-11-08 DIAGNOSIS — Z1231 Encounter for screening mammogram for malignant neoplasm of breast: Secondary | ICD-10-CM

## 2014-02-28 ENCOUNTER — Ambulatory Visit (INDEPENDENT_AMBULATORY_CARE_PROVIDER_SITE_OTHER): Payer: BC Managed Care – PPO

## 2014-02-28 ENCOUNTER — Ambulatory Visit (INDEPENDENT_AMBULATORY_CARE_PROVIDER_SITE_OTHER): Payer: BC Managed Care – PPO | Admitting: Family Medicine

## 2014-02-28 VITALS — BP 120/62 | HR 86 | Temp 98.3°F | Resp 16 | Ht 68.0 in | Wt 142.0 lb

## 2014-02-28 DIAGNOSIS — S9000XA Contusion of unspecified ankle, initial encounter: Secondary | ICD-10-CM

## 2014-02-28 DIAGNOSIS — M25572 Pain in left ankle and joints of left foot: Secondary | ICD-10-CM

## 2014-02-28 DIAGNOSIS — M25579 Pain in unspecified ankle and joints of unspecified foot: Secondary | ICD-10-CM

## 2014-02-28 DIAGNOSIS — S9002XA Contusion of left ankle, initial encounter: Secondary | ICD-10-CM

## 2014-02-28 MED ORDER — TRAMADOL HCL 50 MG PO TABS
50.0000 mg | ORAL_TABLET | Freq: Four times a day (QID) | ORAL | Status: DC | PRN
Start: 2014-02-28 — End: 2016-01-28

## 2014-02-28 NOTE — Patient Instructions (Signed)
Use the tramadol as needed for more severe pain. Continue to ice and elevate your leg and use the crutches to keep pressure off your leg. Use the "aircast" as needed for support.  Let me know if you are not feeling better in the next few days!

## 2014-02-28 NOTE — Progress Notes (Signed)
[facility] [address], [place] [address] [phone]  Date:  02/28/2014   Name:  Kim Irwin   DOB:  09-14-1965   MRN:  [id number]  PCP:  Lamar Blinks, MD    Chief Complaint: Ankle Injury   History of Present Illness:  Kim Irwin is a 48 y.o. very pleasant female patient who presents with the following:  Last night she was practicing softball and a thrown ball hit her in the left shin just above the lateral ankle.  She had immediate pain and swelling and is using ice.   She is generally otherwise healthy and unhurt.  S.p ablaterion  Patient Active Problem List   Diagnosis Date Noted  . Constipation 09/28/2011  . Bleeding internal hemorrhoids 09/28/2011  . Health care maintenance 08/20/2011    Past Medical History  Diagnosis Date  . Hearing loss   . Urinary tract infection   . Pneumonia   . Arthritis     Past Surgical History  Procedure Laterality Date  . Tubal ligation    . Endometrial ablation      History  Substance Use Topics  . Smoking status: Former Smoker -- 0.20 packs/day for 15 years    Types: Cigarettes    Quit date: 07/18/1996  . Smokeless tobacco: Never Used  . Alcohol Use: Yes     Comment: daily wine    Family History  Problem Relation Age of Onset  . Hyperlipidemia Father   . Asthma Father   . Other Mother     Pace maker    Allergies  Allergen Reactions  . Macrobid [Nitrofurantoin]     Medication list has been reviewed and updated.  Current Outpatient Prescriptions on File Prior to Visit  Medication Sig Dispense Refill  . cyclobenzaprine (FLEXERIL) 10 MG tablet Take 10 mg by mouth 3 (three) times daily as needed.      . cyclobenzaprine (FLEXERIL) 10 MG tablet Take 1 tablet (10 mg total) by mouth 3 (three) times daily as needed for muscle spasms.  30 tablet  0  . docusate sodium (COLACE) 100 MG capsule Take 100 mg by mouth as needed.      Marland Kitchen HYDROcodone-acetaminophen (NORCO) 5-325 MG per  tablet Take 1-2 tablets by mouth every 4 (four) hours as needed for pain.  30 tablet  0  . Hydrocodone-Acetaminophen (VICODIN) 5-300 MG TABS Take 1 each by mouth 4 (four) times daily as needed.  20 each  0  . Linaclotide (LINZESS) 145 MCG CAPS Take 1 capsule by mouth daily.  30 capsule  0  . methylPREDNISolone (MEDROL, PAK,) 4 MG tablet follow package directions  21 tablet  0  . MOVIPREP 100 G SOLR Take 1 kit (100 g total) by mouth once.  1 kit  0  . naproxen sodium (ANAPROX DS) 550 MG tablet Take 1 tablet (550 mg total) by mouth 2 (two) times daily with a meal.  40 tablet  0  . norethindrone-ethinyl estradiol 1/35 (ORTHO-NOVUM, NORTREL,CYCLAFEM) tablet Take 1 tablet by mouth daily.  1 Package  0  . predniSONE (DELTASONE) 10 MG tablet Take 10 mg by mouth 2 (two) times daily.      . traMADol (ULTRAM) 50 MG tablet Take 50 mg by mouth every 6 (six) hours as needed.       No current facility-administered medications on file prior to visit.    Review of Systems:  As per HPI- otherwise negative.  Physical Examination: Filed Vitals:  02/28/14 0951  BP: 120/62  Pulse: 86  Temp: 98.3 F (36.8 C)  Resp: 16   Filed Vitals:   02/28/14 0951  Height: 5' 8"  (1.727 m)  Weight: 142 lb (64.411 kg)   Body mass index is 21.6 kg/(m^2). Ideal Body Weight: Weight in (lb) to have BMI = 25: 164.1  GEN: WDWN, NAD, Non-toxic, A & O x 3 HEENT: Atraumatic, Normocephalic. Neck supple. No masses, No LAD. Ears and Nose: No external deformity. CV: RRR, No M/G/R. No JVD. No thrill. No extra heart sounds. PULM: CTA B, no wheezes, crackles, rhonchi. No retractions. No resp. distress. No accessory muscle use. EXTR: No c/c/e NEURO favoring left but able to walk PSYCH: Normally interactive. Conversant. Not depressed or anxious appearing.  Calm demeanor.  Left ankle: contused over the left lateral ankle, superior to the malleolus.  The area is bruised and tender to palpation.  The ankle joint and knee are  negative, foot is negative with normal pulses and perfusion/ sensation  [facility] reading (PRIMARY) by  Dr. Lorelei Pont. Left ankle: negative Left tib/fib: negative  LEFT TIBIA AND FIBULA - 2 VIEW  COMPARISON: None.  FINDINGS: Bone mineralization is within normal limits. Alignment at the left knee and ankle appear preserved. Left tibia and fibula intact.  IMPRESSION: No acute fracture or dislocation identified about the left tib-fib.  LEFT ANKLE COMPLETE - 3+ VIEW  COMPARISON: Left tib-fib series from the same day reported separately.  FINDINGS: Bone mineralization is within normal limits. Normal mortise joint alignment. Talar dome intact. No evidence of joint effusion. Calcaneus intact. Lateral malleolus and distal fibula intact. No acute fracture or dislocation identified.  IMPRESSION: No acute fracture or dislocation identified about the left ankle.  aircast and crutches- she felt better with aircast support  Assessment and Plan: Left ankle pain - Plan: DG Ankle Complete Left, DG Tibia/Fibula Left, traMADol (ULTRAM) 50 MG tablet  Contusion of left ankle, initial encounter - Plan: traMADol (ULTRAM) 50 MG tablet  Contusion to left ankle but no evidence of fracture Treat with aircast and crutches, tramadol as needed, ice and elevate.   See patient instructions for more details.     Signed Lamar Blinks, MD

## 2014-04-12 ENCOUNTER — Encounter: Payer: Self-pay | Admitting: Gastroenterology

## 2014-11-19 ENCOUNTER — Other Ambulatory Visit (HOSPITAL_COMMUNITY): Payer: Self-pay | Admitting: Obstetrics and Gynecology

## 2014-11-19 ENCOUNTER — Other Ambulatory Visit (HOSPITAL_COMMUNITY)
Admission: RE | Admit: 2014-11-19 | Discharge: 2014-11-19 | Disposition: A | Discharge status: Home / Self Care | Payer: BLUE CROSS/BLUE SHIELD | Source: Ambulatory Visit | Attending: Obstetrics and Gynecology | Admitting: Obstetrics and Gynecology

## 2014-11-19 DIAGNOSIS — Z01419 Encounter for gynecological examination (general) (routine) without abnormal findings: Secondary | ICD-10-CM | POA: Diagnosis present

## 2014-11-21 LAB — CYTOLOGY - PAP

## 2014-11-28 ENCOUNTER — Other Ambulatory Visit: Payer: Self-pay

## 2014-11-28 DIAGNOSIS — Z1231 Encounter for screening mammogram for malignant neoplasm of breast: Secondary | ICD-10-CM

## 2014-12-22 ENCOUNTER — Ambulatory Visit
Admission: RE | Admit: 2014-12-22 | Discharge: 2014-12-22 | Disposition: A | Discharge status: Home / Self Care | Payer: BLUE CROSS/BLUE SHIELD | Source: Ambulatory Visit

## 2014-12-22 DIAGNOSIS — Z1231 Encounter for screening mammogram for malignant neoplasm of breast: Secondary | ICD-10-CM

## 2015-08-07 ENCOUNTER — Encounter: Payer: Self-pay | Admitting: Family Medicine

## 2015-08-12 ENCOUNTER — Encounter: Payer: Self-pay | Admitting: Family Medicine

## 2015-11-18 ENCOUNTER — Encounter: Payer: Self-pay | Admitting: Gastroenterology

## 2015-11-23 ENCOUNTER — Other Ambulatory Visit: Payer: Self-pay

## 2015-11-23 DIAGNOSIS — Z1231 Encounter for screening mammogram for malignant neoplasm of breast: Secondary | ICD-10-CM

## 2015-11-26 ENCOUNTER — Other Ambulatory Visit (HOSPITAL_COMMUNITY)
Admission: RE | Admit: 2015-11-26 | Discharge: 2015-11-26 | Disposition: A | Discharge status: Home / Self Care | Payer: BLUE CROSS/BLUE SHIELD | Source: Ambulatory Visit | Attending: Obstetrics and Gynecology | Admitting: Obstetrics and Gynecology

## 2015-11-26 ENCOUNTER — Other Ambulatory Visit: Payer: Self-pay | Admitting: Obstetrics and Gynecology

## 2015-11-26 DIAGNOSIS — Z1151 Encounter for screening for human papillomavirus (HPV): Secondary | ICD-10-CM | POA: Insufficient documentation

## 2015-11-26 DIAGNOSIS — Z01419 Encounter for gynecological examination (general) (routine) without abnormal findings: Secondary | ICD-10-CM | POA: Insufficient documentation

## 2015-11-30 LAB — CYTOLOGY - PAP

## 2015-12-23 ENCOUNTER — Ambulatory Visit
Admission: RE | Admit: 2015-12-23 | Discharge: 2015-12-23 | Disposition: A | Discharge status: Home / Self Care | Payer: BLUE CROSS/BLUE SHIELD | Source: Ambulatory Visit

## 2015-12-23 DIAGNOSIS — Z1231 Encounter for screening mammogram for malignant neoplasm of breast: Secondary | ICD-10-CM

## 2016-01-28 ENCOUNTER — Ambulatory Visit (AMBULATORY_SURGERY_CENTER): Payer: Self-pay | Admitting: *Deleted

## 2016-01-28 VITALS — Ht 67.5 in | Wt 160.8 lb

## 2016-01-28 DIAGNOSIS — Z1211 Encounter for screening for malignant neoplasm of colon: Secondary | ICD-10-CM

## 2016-01-28 MED ORDER — SUPREP BOWEL PREP KIT 17.5-3.13-1.6 GM/177ML PO SOLN: 1.0000 | Freq: Once | ORAL | Status: DC

## 2016-01-28 NOTE — Progress Notes (Signed)
Patient denies any allergies to egg or soy products. Patient denies complications with anesthesia/sedation.  Patient denies oxygen use at home and denies diet medications. Emmi instructions for colonoscopy explained but patient denied.  Denied pamphlet.

## 2016-01-29 ENCOUNTER — Encounter: Payer: Self-pay | Admitting: Gastroenterology

## 2016-02-10 ENCOUNTER — Ambulatory Visit (AMBULATORY_SURGERY_CENTER): Payer: BLUE CROSS/BLUE SHIELD | Admitting: Gastroenterology

## 2016-02-10 ENCOUNTER — Encounter: Payer: Self-pay | Admitting: Gastroenterology

## 2016-02-10 VITALS — BP 123/74 | HR 72 | Temp 98.9°F | Resp 13 | Ht 67.0 in | Wt 160.0 lb

## 2016-02-10 DIAGNOSIS — K635 Polyp of colon: Secondary | ICD-10-CM

## 2016-02-10 DIAGNOSIS — Z1211 Encounter for screening for malignant neoplasm of colon: Secondary | ICD-10-CM | POA: Diagnosis present

## 2016-02-10 DIAGNOSIS — D123 Benign neoplasm of transverse colon: Secondary | ICD-10-CM | POA: Diagnosis not present

## 2016-02-10 NOTE — Progress Notes (Signed)
No problems noted in the recovery room. maw 

## 2016-02-10 NOTE — Op Note (Signed)
[place] Patient Name: Kim Irwin Procedure Date: 02/10/2016 10:23 AM MRN: [id number] Endoscopist: Ladene Artist , MD Age: 50 Referring MD:  Date of Birth: 04/09/1966 Gender: Female Account #: [id number] Procedure:                Colonoscopy Indications:              Screening for colorectal malignant neoplasm Medicines:                Monitored Anesthesia Care Procedure:                Pre-Anesthesia Assessment:                           - Prior to the procedure, a History and Physical                            was performed, and patient medications and                            allergies were reviewed. The patient's tolerance of                            previous anesthesia was also reviewed. The risks                            and benefits of the procedure and the sedation                            options and risks were discussed with the patient.                            All questions were answered, and informed consent                            was obtained. Prior Anticoagulants: The patient has                            taken no previous anticoagulant or antiplatelet                            agents. ASA Grade Assessment: II - A patient with                            mild systemic disease. After reviewing the risks                            and benefits, the patient was deemed in                            satisfactory condition to undergo the procedure.                           After obtaining informed consent, the colonoscope  was passed under direct vision. Throughout the                            procedure, the patient's blood pressure, pulse, and                            oxygen saturations were monitored continuously.The                            colonoscopy was performed without difficulty. The                            patient tolerated the procedure well. The quality                            of the bowel  preparation was good. The ileocecal                            valve, appendiceal orifice, and rectum were                            photographed. The Model PCF-[id number])                            scope was introduced through the anus and advanced                            to the the cecum, identified by appendiceal orifice                            and ileocecal valve. Scope In: 10:50:32 AM Scope Out: 11:02:31 AM Scope Withdrawal Time: 0 hours 9 minutes 30 seconds  Total Procedure Duration: 0 hours 11 minutes 59 seconds  Findings:                 A 6 mm polyp was found in the transverse colon. The                            polyp was sessile. The polyp was removed with a                            cold snare. Resection and retrieval were complete.                           The exam was otherwise normal throughout the                            examined colon.                           The retroflexed view of the distal rectum and anal                            verge was normal and showed no anal or rectal  abnormalities. Complications:            No immediate complications. Estimated Blood Loss:     Estimated blood loss: none. Impression:               - One 6 mm polyp in the transverse colon, removed                            with a cold snare. Resected and retrieved.                           - The distal rectum and anal verge are normal on                            retroflexion view. Recommendation:           - Patient has a contact number available for                            emergencies. The signs and symptoms of potential                            delayed complications were discussed with the                            patient. Return to normal activities tomorrow.                            Written discharge instructions were provided to the                            patient.                           - Resume previous diet.                            - Continue present medications.                           - Await pathology results.                           - Repeat colonoscopy in 5 years for surveillance if                            polyp is precancerous, otherwise 10 years for                            screening. Ladene Artist, MD 02/10/2016 11:07:09 AM This report has been signed electronically.

## 2016-02-10 NOTE — Patient Instructions (Signed)

## 2016-02-10 NOTE — Progress Notes (Signed)
Called to room to assist during endoscopic procedure.  Patient ID and intended procedure confirmed with present staff. Received instructions for my participation in the procedure from the performing physician.  

## 2016-02-10 NOTE — Progress Notes (Signed)
To recovery, report to Willis, RN, VSS 

## 2016-02-11 ENCOUNTER — Telehealth: Payer: Self-pay | Admitting: *Deleted

## 2016-02-11 NOTE — Telephone Encounter (Signed)
  Follow up Call-  Call back number 02/10/2016  Post procedure Call Back phone  # [phone]  Permission to leave phone message Yes  Some recent data might be hidden     Patient questions:  Do you have a fever, pain , or abdominal swelling? No. Pain Score  0 *  Have you tolerated food without any problems? Yes.    Have you been able to return to your normal activities? Yes.    Do you have any questions about your discharge instructions: Diet   No. Medications  No. Follow up visit  No.  Do you have questions or concerns about your Care? No.  Actions: * If pain score is 4 or above: No action needed, pain <4.

## 2016-02-17 ENCOUNTER — Encounter: Payer: Self-pay | Admitting: Gastroenterology

## 2016-11-16 ENCOUNTER — Other Ambulatory Visit: Payer: Self-pay | Admitting: Obstetrics and Gynecology

## 2016-11-16 DIAGNOSIS — Z1231 Encounter for screening mammogram for malignant neoplasm of breast: Secondary | ICD-10-CM

## 2016-12-26 ENCOUNTER — Ambulatory Visit: Payer: BLUE CROSS/BLUE SHIELD

## 2017-01-02 ENCOUNTER — Ambulatory Visit
Admission: RE | Admit: 2017-01-02 | Discharge: 2017-01-02 | Disposition: A | Discharge status: Home / Self Care | Payer: BLUE CROSS/BLUE SHIELD | Source: Ambulatory Visit | Attending: Obstetrics and Gynecology | Admitting: Obstetrics and Gynecology

## 2017-01-02 DIAGNOSIS — Z1231 Encounter for screening mammogram for malignant neoplasm of breast: Secondary | ICD-10-CM

## 2017-01-04 ENCOUNTER — Other Ambulatory Visit: Payer: Self-pay | Admitting: Obstetrics and Gynecology

## 2017-01-04 DIAGNOSIS — R928 Other abnormal and inconclusive findings on diagnostic imaging of breast: Secondary | ICD-10-CM

## 2017-01-06 ENCOUNTER — Ambulatory Visit
Admission: RE | Admit: 2017-01-06 | Discharge: 2017-01-06 | Disposition: A | Discharge status: Home / Self Care | Payer: BLUE CROSS/BLUE SHIELD | Source: Ambulatory Visit | Attending: Obstetrics and Gynecology | Admitting: Obstetrics and Gynecology

## 2017-01-06 DIAGNOSIS — R928 Other abnormal and inconclusive findings on diagnostic imaging of breast: Secondary | ICD-10-CM

## 2017-05-27 DIAGNOSIS — S4991XA Unspecified injury of right shoulder and upper arm, initial encounter: Secondary | ICD-10-CM | POA: Insufficient documentation

## 2017-11-27 ENCOUNTER — Other Ambulatory Visit: Payer: Self-pay | Admitting: Obstetrics and Gynecology

## 2017-11-27 DIAGNOSIS — Z1231 Encounter for screening mammogram for malignant neoplasm of breast: Secondary | ICD-10-CM

## 2018-01-03 ENCOUNTER — Ambulatory Visit
Admission: RE | Admit: 2018-01-03 | Discharge: 2018-01-03 | Disposition: A | Discharge status: Home / Self Care | Payer: BLUE CROSS/BLUE SHIELD | Source: Ambulatory Visit | Attending: Obstetrics and Gynecology | Admitting: Obstetrics and Gynecology

## 2018-01-03 DIAGNOSIS — Z1231 Encounter for screening mammogram for malignant neoplasm of breast: Secondary | ICD-10-CM

## 2018-11-23 ENCOUNTER — Other Ambulatory Visit: Payer: Self-pay | Admitting: Obstetrics and Gynecology

## 2018-11-23 DIAGNOSIS — Z1231 Encounter for screening mammogram for malignant neoplasm of breast: Secondary | ICD-10-CM

## 2019-01-03 DIAGNOSIS — G5603 Carpal tunnel syndrome, bilateral upper limbs: Secondary | ICD-10-CM | POA: Insufficient documentation

## 2019-01-04 ENCOUNTER — Encounter: Payer: Self-pay | Admitting: Neurology

## 2019-01-16 ENCOUNTER — Ambulatory Visit: Payer: BLUE CROSS/BLUE SHIELD

## 2019-01-22 ENCOUNTER — Other Ambulatory Visit (HOSPITAL_COMMUNITY)
Admission: RE | Admit: 2019-01-22 | Discharge: 2019-01-22 | Disposition: A | Discharge status: Home / Self Care | Payer: BC Managed Care – PPO | Source: Ambulatory Visit | Attending: Obstetrics and Gynecology | Admitting: Obstetrics and Gynecology

## 2019-01-22 ENCOUNTER — Other Ambulatory Visit: Payer: Self-pay | Admitting: Obstetrics and Gynecology

## 2019-01-22 DIAGNOSIS — Z01419 Encounter for gynecological examination (general) (routine) without abnormal findings: Secondary | ICD-10-CM | POA: Diagnosis present

## 2019-01-25 LAB — CYTOLOGY - PAP
Diagnosis: NEGATIVE
HPV: NOT DETECTED

## 2019-01-29 ENCOUNTER — Ambulatory Visit (INDEPENDENT_AMBULATORY_CARE_PROVIDER_SITE_OTHER): Payer: BC Managed Care – PPO | Admitting: Neurology

## 2019-01-29 ENCOUNTER — Other Ambulatory Visit: Payer: Self-pay

## 2019-01-29 DIAGNOSIS — G5603 Carpal tunnel syndrome, bilateral upper limbs: Secondary | ICD-10-CM

## 2019-01-29 DIAGNOSIS — G4700 Insomnia, unspecified: Secondary | ICD-10-CM | POA: Insufficient documentation

## 2019-01-29 DIAGNOSIS — R252 Cramp and spasm: Secondary | ICD-10-CM | POA: Insufficient documentation

## 2019-01-29 DIAGNOSIS — G5600 Carpal tunnel syndrome, unspecified upper limb: Secondary | ICD-10-CM | POA: Insufficient documentation

## 2019-01-29 DIAGNOSIS — R202 Paresthesia of skin: Secondary | ICD-10-CM

## 2019-01-29 DIAGNOSIS — J0140 Acute pansinusitis, unspecified: Secondary | ICD-10-CM | POA: Insufficient documentation

## 2019-01-29 DIAGNOSIS — R1013 Epigastric pain: Secondary | ICD-10-CM | POA: Insufficient documentation

## 2019-01-29 DIAGNOSIS — S6990XA Unspecified injury of unspecified wrist, hand and finger(s), initial encounter: Secondary | ICD-10-CM | POA: Insufficient documentation

## 2019-01-29 NOTE — Procedures (Signed)
[facility]  [address], [place]  [place], [place] [address] Tel: [phone] Fax:  [phone] Test Date:  01/29/2019  Patient: Kim Irwin DOB: [birth year] Physician: Narda Amber, DO  Sex: Female Height: 5\' 8"  Ref Phys: Charlotte Crumb, MD  ID#: [id number] Temp: 32.0C Technician:    Patient Complaints: This is a 53 year old female referred for evaluation of bilateral hand numbness and tingling.  NCV & EMG Findings: Extensive electrodiagnostic testing of the right upper extremity and additional studies of the left shows: 1. Bilateral median, ulnar, and mixed palmar sensory responses are within normal limits. 2. Bilateral median and ulnar motor responses are within normal limits. 3. There is no evidence of active or chronic motor axonal loss changes affecting any of the tested muscles.  Motor unit configuration and recruitment pattern is within normal limits.  Impression: This is a normal study of bilateral upper extremities.  In particular, there is no evidence of carpal tunnel syndrome or cervical radiculopathy.   ___________________________ Narda Amber, DO    Nerve Conduction Studies Anti Sensory Summary Table   Site NR Peak (ms) Norm Peak (ms) P-T Amp (V) Norm P-T Amp  Left Median Anti Sensory (2nd Digit)  32C  Wrist    3.4 <3.6 32.9 >15  Right Median Anti Sensory (2nd Digit)  32C  Wrist    3.3 <3.6 29.4 >15  Left Ulnar Anti Sensory (5th Digit)  32C  Wrist    3.1 <3.1 31.3 >10  Right Ulnar Anti Sensory (5th Digit)  32C  Wrist    2.8 <3.1 29.1 >10   Motor Summary Table   Site NR Onset (ms) Norm Onset (ms) O-P Amp (mV) Norm O-P Amp Site1 Site2 Delta-0 (ms) Dist (cm) Vel (m/s) Norm Vel (m/s)  Left Median Motor (Abd Poll Brev)  32C  Wrist    2.8 <4.0 9.2 >6 Elbow Wrist 4.8 29.0 60 >50  Elbow    7.6  8.8         Right Median Motor (Abd Poll Brev)  32C  Wrist    2.8 <4.0 7.9 >6 Elbow Wrist 5.1 31.0 61 >50  Elbow    7.9  7.8         Left  Ulnar Motor (Abd Dig Minimi)  32C  Wrist    2.8 <3.1 10.8 >7 B Elbow Wrist 3.7 23.0 62 >50  B Elbow    6.5  10.4  A Elbow B Elbow 1.7 10.0 59 >50  A Elbow    8.2  10.0         Right Ulnar Motor (Abd Dig Minimi)  32C  Wrist    2.3 <3.1 12.6 >7 B Elbow Wrist 3.6 22.0 61 >50  B Elbow    5.9  11.0  A Elbow B Elbow 1.8 10.0 56 >50  A Elbow    7.7  10.6          Comparison Summary Table   Site NR Peak (ms) Norm Peak (ms) P-T Amp (V) Site1 Site2 Delta-P (ms) Norm Delta (ms)  Left Median/Ulnar Palm Comparison (Wrist - 8cm)  32C  Median Palm    1.9 <2.2 56.4 Median Palm Ulnar Palm 0.2   Ulnar Palm    1.7 <2.2 28.0      Right Median/Ulnar Palm Comparison (Wrist - 8cm)  32C  Median Palm    1.9 <2.2 52.5 Median Palm Ulnar Palm 0.2   Ulnar Palm    1.7 <2.2 18.4  EMG   Side Muscle Ins Act Fibs Psw Fasc Number Recrt Dur Dur. Amp Amp. Poly Poly. Comment  Right 1stDorInt Nml Nml Nml Nml Nml Nml Nml Nml Nml Nml Nml Nml N/A  Right PronatorTeres Nml Nml Nml Nml Nml Nml Nml Nml Nml Nml Nml Nml N/A  Right Biceps Nml Nml Nml Nml Nml Nml Nml Nml Nml Nml Nml Nml N/A  Right Triceps Nml Nml Nml Nml Nml Nml Nml Nml Nml Nml Nml Nml N/A  Right Deltoid Nml Nml Nml Nml Nml Nml Nml Nml Nml Nml Nml Nml N/A  Left 1stDorInt Nml Nml Nml Nml Nml Nml Nml Nml Nml Nml Nml Nml N/A  Left PronatorTeres Nml Nml Nml Nml Nml Nml Nml Nml Nml Nml Nml Nml N/A  Left Biceps Nml Nml Nml Nml Nml Nml Nml Nml Nml Nml Nml Nml N/A  Left Triceps Nml Nml Nml Nml Nml Nml Nml Nml Nml Nml Nml Nml N/A  Left Deltoid Nml Nml Nml Nml Nml Nml Nml Nml Nml Nml Nml Nml N/A      Waveforms:

## 2019-03-04 ENCOUNTER — Ambulatory Visit
Admission: RE | Admit: 2019-03-04 | Discharge: 2019-03-04 | Disposition: A | Discharge status: Home / Self Care | Payer: BC Managed Care – PPO | Source: Ambulatory Visit | Attending: Obstetrics and Gynecology | Admitting: Obstetrics and Gynecology

## 2019-03-04 ENCOUNTER — Other Ambulatory Visit: Payer: Self-pay

## 2019-03-04 DIAGNOSIS — Z1231 Encounter for screening mammogram for malignant neoplasm of breast: Secondary | ICD-10-CM

## 2019-10-02 DIAGNOSIS — L298 Other pruritus: Secondary | ICD-10-CM | POA: Diagnosis not present

## 2019-10-02 DIAGNOSIS — L570 Actinic keratosis: Secondary | ICD-10-CM | POA: Diagnosis not present

## 2019-10-02 DIAGNOSIS — L814 Other melanin hyperpigmentation: Secondary | ICD-10-CM | POA: Diagnosis not present

## 2019-10-02 DIAGNOSIS — D485 Neoplasm of uncertain behavior of skin: Secondary | ICD-10-CM | POA: Diagnosis not present

## 2020-01-03 ENCOUNTER — Other Ambulatory Visit: Payer: Self-pay | Admitting: Obstetrics and Gynecology

## 2020-01-03 DIAGNOSIS — Z1231 Encounter for screening mammogram for malignant neoplasm of breast: Secondary | ICD-10-CM

## 2020-01-16 DIAGNOSIS — G5621 Lesion of ulnar nerve, right upper limb: Secondary | ICD-10-CM | POA: Diagnosis not present

## 2020-01-16 DIAGNOSIS — M79641 Pain in right hand: Secondary | ICD-10-CM | POA: Diagnosis not present

## 2020-01-16 DIAGNOSIS — G5603 Carpal tunnel syndrome, bilateral upper limbs: Secondary | ICD-10-CM | POA: Diagnosis not present

## 2020-01-16 DIAGNOSIS — M79642 Pain in left hand: Secondary | ICD-10-CM | POA: Diagnosis not present

## 2020-01-24 DIAGNOSIS — G5601 Carpal tunnel syndrome, right upper limb: Secondary | ICD-10-CM | POA: Diagnosis not present

## 2020-01-24 DIAGNOSIS — G5621 Lesion of ulnar nerve, right upper limb: Secondary | ICD-10-CM | POA: Diagnosis not present

## 2020-03-05 ENCOUNTER — Ambulatory Visit
Admission: RE | Admit: 2020-03-05 | Discharge: 2020-03-05 | Disposition: A | Discharge status: Home / Self Care | Payer: BC Managed Care – PPO | Source: Ambulatory Visit | Attending: Obstetrics and Gynecology | Admitting: Obstetrics and Gynecology

## 2020-03-05 ENCOUNTER — Other Ambulatory Visit: Payer: Self-pay

## 2020-03-05 DIAGNOSIS — Z1231 Encounter for screening mammogram for malignant neoplasm of breast: Secondary | ICD-10-CM

## 2020-03-17 DIAGNOSIS — Z01419 Encounter for gynecological examination (general) (routine) without abnormal findings: Secondary | ICD-10-CM | POA: Diagnosis not present

## 2020-08-01 DIAGNOSIS — M549 Dorsalgia, unspecified: Secondary | ICD-10-CM | POA: Diagnosis not present

## 2020-08-01 DIAGNOSIS — R059 Cough, unspecified: Secondary | ICD-10-CM | POA: Diagnosis not present

## 2020-08-01 DIAGNOSIS — R0602 Shortness of breath: Secondary | ICD-10-CM | POA: Diagnosis not present

## 2020-08-01 DIAGNOSIS — R0981 Nasal congestion: Secondary | ICD-10-CM | POA: Diagnosis not present

## 2020-08-01 DIAGNOSIS — U071 COVID-19: Secondary | ICD-10-CM | POA: Diagnosis not present

## 2020-08-01 DIAGNOSIS — R058 Other specified cough: Secondary | ICD-10-CM | POA: Diagnosis not present

## 2020-08-01 DIAGNOSIS — M791 Myalgia, unspecified site: Secondary | ICD-10-CM | POA: Diagnosis not present

## 2020-08-01 DIAGNOSIS — G8929 Other chronic pain: Secondary | ICD-10-CM | POA: Diagnosis not present

## 2020-08-11 DIAGNOSIS — R051 Acute cough: Secondary | ICD-10-CM | POA: Diagnosis not present

## 2020-09-04 ENCOUNTER — Ambulatory Visit (HOSPITAL_COMMUNITY)
Admission: EM | Admit: 2020-09-04 | Discharge: 2020-09-04 | Disposition: A | Discharge status: Nursing Home (Includes ICF) | Payer: BLUE CROSS/BLUE SHIELD | Attending: Internal Medicine | Admitting: Internal Medicine

## 2020-09-04 ENCOUNTER — Other Ambulatory Visit: Payer: Self-pay

## 2020-09-04 ENCOUNTER — Emergency Department (HOSPITAL_COMMUNITY): Payer: BLUE CROSS/BLUE SHIELD

## 2020-09-04 ENCOUNTER — Encounter (HOSPITAL_COMMUNITY): Payer: Self-pay | Admitting: Emergency Medicine

## 2020-09-04 ENCOUNTER — Observation Stay (HOSPITAL_COMMUNITY)
Admission: EM | Admit: 2020-09-04 | Discharge: 2020-09-05 | Disposition: A | Discharge status: Home / Self Care | Payer: BLUE CROSS/BLUE SHIELD | Attending: Emergency Medicine | Admitting: Emergency Medicine

## 2020-09-04 DIAGNOSIS — I10 Essential (primary) hypertension: Secondary | ICD-10-CM | POA: Diagnosis not present

## 2020-09-04 DIAGNOSIS — I4891 Unspecified atrial fibrillation: Secondary | ICD-10-CM

## 2020-09-04 DIAGNOSIS — Z8659 Personal history of other mental and behavioral disorders: Secondary | ICD-10-CM

## 2020-09-04 DIAGNOSIS — F1599 Other stimulant use, unspecified with unspecified stimulant-induced disorder: Secondary | ICD-10-CM | POA: Diagnosis present

## 2020-09-04 DIAGNOSIS — Z87891 Personal history of nicotine dependence: Secondary | ICD-10-CM | POA: Diagnosis not present

## 2020-09-04 DIAGNOSIS — R42 Dizziness and giddiness: Secondary | ICD-10-CM | POA: Diagnosis not present

## 2020-09-04 DIAGNOSIS — R002 Palpitations: Secondary | ICD-10-CM | POA: Diagnosis not present

## 2020-09-04 DIAGNOSIS — U071 COVID-19: Secondary | ICD-10-CM | POA: Diagnosis present

## 2020-09-04 DIAGNOSIS — Z79899 Other long term (current) drug therapy: Secondary | ICD-10-CM | POA: Diagnosis not present

## 2020-09-04 DIAGNOSIS — E876 Hypokalemia: Secondary | ICD-10-CM

## 2020-09-04 DIAGNOSIS — R0602 Shortness of breath: Secondary | ICD-10-CM

## 2020-09-04 LAB — CBC
HCT: 38.4 % (ref 36.0–46.0)
Hemoglobin: 13.2 g/dL (ref 12.0–15.0)
MCH: 31.7 pg (ref 26.0–34.0)
MCHC: 34.4 g/dL (ref 30.0–36.0)
MCV: 92.3 fL (ref 80.0–100.0)
Platelets: 269 10*3/uL (ref 150–400)
RBC: 4.16 MIL/uL (ref 3.87–5.11)
RDW: 13.1 % (ref 11.5–15.5)
WBC: 6.1 10*3/uL (ref 4.0–10.5)
nRBC: 0 % (ref 0.0–0.2)

## 2020-09-04 LAB — BASIC METABOLIC PANEL
Anion gap: 11 (ref 5–15)
BUN: 13 mg/dL (ref 6–20)
CO2: 23 mmol/L (ref 22–32)
Calcium: 9.5 mg/dL (ref 8.9–10.3)
Chloride: 105 mmol/L (ref 98–111)
Creatinine, Ser: 0.84 mg/dL (ref 0.44–1.00)
GFR, Estimated: >60 mL/min (ref >60)
Glucose, Bld: 105 mg/dL — ABNORMAL HIGH (ref 70–99)
Potassium: 3.3 mmol/L — ABNORMAL LOW (ref 3.5–5.1)
Sodium: 139 mmol/L (ref 135–145)

## 2020-09-04 LAB — TROPONIN I (HIGH SENSITIVITY)
Troponin I (High Sensitivity): 3 ng/L (ref <18)
Troponin I (High Sensitivity): 4 ng/L (ref <18)

## 2020-09-04 LAB — I-STAT BETA HCG BLOOD, ED (MC, WL, AP ONLY): I-stat hCG, quantitative: <5 m[IU]/mL (ref <5)

## 2020-09-04 MED ORDER — POTASSIUM CHLORIDE CRYS ER 20 MEQ PO TBCR
40.0000 meq | EXTENDED_RELEASE_TABLET | Freq: Once | ORAL | Status: AC
  Administered 2020-09-05: 40 meq via ORAL
  Filled 2020-09-04: qty 2

## 2020-09-04 MED ORDER — ETOMIDATE 2 MG/ML IV SOLN
10.0000 mg | Freq: Once | INTRAVENOUS | Status: AC
  Administered 2020-09-04: 10 mg via INTRAVENOUS
  Filled 2020-09-04: qty 10

## 2020-09-04 MED ORDER — DILTIAZEM HCL-DEXTROSE 125-5 MG/125ML-% IV SOLN (PREMIX)
5.0000 mg/h | INTRAVENOUS | Status: DC
  Administered 2020-09-05: 5 mg/h via INTRAVENOUS
  Filled 2020-09-04: qty 125

## 2020-09-04 MED ORDER — SODIUM CHLORIDE 0.9 % IV BOLUS
1000.0000 mL | Freq: Once | INTRAVENOUS | Status: AC
  Administered 2020-09-05: 1000 mL via INTRAVENOUS

## 2020-09-04 MED ORDER — ETOMIDATE 2 MG/ML IV SOLN
INTRAVENOUS | Status: AC | PRN
  Administered 2020-09-04: 10 mg via INTRAVENOUS

## 2020-09-04 NOTE — ED Provider Notes (Signed)
Kim Irwin   MRN: [id number] DOB: May 09, 1966  Subjective:   Kim Irwin is a 55 y.o. female presenting for acute onset today of shortness of breath, left hand numbness and tingling, heart racing and palpitations.  Symptoms started after she took phentermine.  She also took tramadol this morning.  Denies history of heart conditions but does state that she had an abnormal heartbeat when she was in middle school.  Eventually this resolved on its own and she didn't need further follow-up.  Denies any active headache, confusion, vision change, chest pain, belly pain, weakness.  No current facility-administered medications for this encounter.  Current Outpatient Medications:  .  busPIRone (BUSPAR) 15 MG tablet, Take 15 mg by mouth 3 (three) times daily., Disp: , Rfl:  .  docusate sodium (COLACE) 100 MG capsule, Take 100 mg by mouth as needed. Reported on 01/28/2016, Disp: , Rfl:  .  HYDROcodone-acetaminophen (NORCO/VICODIN) 5-325 MG tablet, Take by mouth., Disp: , Rfl:    Allergies  Allergen Reactions  . Macrobid [Nitrofurantoin] Other (See Comments)    Flu like symptoms    Past Medical History:  Diagnosis Date  . Allergy    macrobid  . Anxiety   . Arthritis    hands  . GERD (gastroesophageal reflux disease)   . Hearing loss    slight, no hearing aids  . Migraine    otc med prn  . Pneumonia    hx - resolved  . SVD (spontaneous vaginal delivery)    x 1  . Urinary tract infection    Hx - resolved     Past Surgical History:  Procedure Laterality Date  . COLONOSCOPY  09/2003   Normal - [facility]  . endometrial ablation    . TUBAL LIGATION    . WRIST SURGERY Right     Family History  Problem Relation Age of Onset  . Hyperlipidemia Father   . Asthma Father   . Other Mother   . Heart disease Mother   . Stomach cancer Paternal Grandfather   . Colon cancer Neg Hx   . Esophageal cancer Neg Hx   . Rectal cancer Neg Hx   . Breast cancer Neg Hx      Social History   Tobacco Use  . Smoking status: Former Smoker    Packs/day: 0.20    Years: 15.00    Pack years: 3.00    Types: Cigarettes    Quit date: 07/18/1996    Years since quitting: 24.1  . Smokeless tobacco: Never Used  Substance Use Topics  . Alcohol use: Yes    Alcohol/week: 10.0 standard drinks    Types: 10 Glasses of wine per week    Comment: daily wine  . Drug use: No    ROS   Objective:   Vitals: BP (!) 153/106 (BP Location: Right Arm)   Pulse 68 Comment: varying rate  Resp 20   SpO2 100%   Physical Exam Constitutional:      General: She is not in acute distress.    Appearance: Normal appearance. She is well-developed. She is not ill-appearing, toxic-appearing or diaphoretic.  HENT:     Head: Normocephalic and atraumatic.     Nose: Nose normal.     Mouth/Throat:     Mouth: Mucous membranes are moist.  Eyes:     Extraocular Movements: Extraocular movements intact.     Pupils: Pupils are equal, round, and reactive to light.  Cardiovascular:  Rate and Rhythm: Normal rate. Rhythm irregular.     Pulses: Normal pulses.     Heart sounds: Normal heart sounds. No murmur heard. No friction rub. No gallop.   Pulmonary:     Effort: Pulmonary effort is normal. No respiratory distress.     Breath sounds: Normal breath sounds. No stridor. No wheezing, rhonchi or rales.  Skin:    General: Skin is warm and dry.     Findings: No rash.  Neurological:     Mental Status: She is alert and oriented to person, place, and time.  Psychiatric:        Mood and Affect: Mood normal.        Behavior: Behavior normal.        Thought Content: Thought content normal.     ED ECG REPORT   Date: 09/04/2020  Rate: 117bpm  Rhythm: irregularly irregular  QRS Axis: normal  Intervals: normal  ST/T Wave abnormalities: normal  Conduction Disutrbances:none  Narrative Interpretation: Atrial fibrillation new in onset, rapid ventricular rate at 117 bpm.  No previous EKG for  comparison.  Old EKG Reviewed: none available  I have personally reviewed the EKG tracing and agree with the computerized printout as noted.   Assessment and Plan :   PDMP not reviewed this encounter.  1. Shortness of breath   2. Atrial fibrillation with RVR ([place])     Patient has new onset atrial fibrillation with RVR.  Discussed this with patient at length.  Recommended transport by EMS to the emergency room for further evaluation and intervention.  An IV line was established prior to EMS arrival.  Report given and transfer of care to EMS.   Jaynee Eagles, PA-C 09/04/20 1722

## 2020-09-04 NOTE — ED Triage Notes (Signed)
Pt arrives via [facility] from urgent care due to irregular heart beat, she states she suddenly began to feel left hand numbness with dizziness and abnormal sensation after taking phentermine at 1200 today. She did also take tramadol this morning which is a newer medication for her.  She was noted to be in afib she does not have a history of this.

## 2020-09-04 NOTE — ED Provider Notes (Signed)
[facility] EMERGENCY DEPARTMENT Provider Note   CSN: [id number] Arrival date & time: 09/04/20  1741   Chief Complaint: New-Onset A-fib  History Chief Complaint  Patient presents with  . Tachycardia    Kim Irwin is a 55 y.o. female.  HPI   Kim Irwin is a 55 y.o. lady w/ PMHx anxiety presenting to the ED at the recommendation of a local urgent care for new-onset A-Fib with RVR. She states she was in her usual state of health this morning aside from pain in her neck. She took a dose of her mother's tramadol at 7am and then took Phentermine at 11:00am this morning. Around 30 minutes later she developed numbness and tingling in her hands (L>R), palpitations, SOB, dizziness, and generalized weakness and fatigue and went to the urgent care with findings as above noted on EKG. Her symptoms have persisted although are significantly improved since this morning. She notes she had an irregular heart rhythm in her 20's diagnosed on Holter monitor by cardiology although states this resolved spontaneously. Denies recurrence of symptoms in the interim. Denies any other PMHx including any bleeding.   Past Medical History:  Diagnosis Date  . Allergy    macrobid  . Anxiety   . Arthritis    hands  . GERD (gastroesophageal reflux disease)   . Hearing loss    slight, no hearing aids  . Migraine    otc med prn  . Pneumonia    hx - resolved  . SVD (spontaneous vaginal delivery)    x 1  . Urinary tract infection    Hx - resolved    Patient Active Problem List   Diagnosis Date Noted  . 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 Acute pansinusitis 01/29/2019  . Carpal tunnel syndrome 01/29/2019  . Epigastric pain 01/29/2019  . Injury of hand 01/29/2019  . Spasm 01/29/2019  . Bilateral carpal tunnel syndrome 01/03/2019  . Right shoulder injury 05/27/2017  . Constipation 09/28/2011  . Bleeding internal hemorrhoids 09/28/2011  . Health care  maintenance 08/20/2011    Past Surgical History:  Procedure Laterality Date  . COLONOSCOPY  09/2003   Normal - [facility]  . endometrial ablation    . TUBAL LIGATION    . WRIST SURGERY Right      OB History   No obstetric history on file.     Family History  Problem Relation Age of Onset  . Hyperlipidemia Father   . Asthma Father   . Other Mother   . Heart disease Mother   . Stomach cancer Paternal Grandfather   . Colon cancer Neg Hx   . Esophageal cancer Neg Hx   . Rectal cancer Neg Hx   . Breast cancer Neg Hx     Social History   Tobacco Use  . Smoking status: Former Smoker    Packs/day: 0.20    Years: 15.00    Pack years: 3.00    Types: Cigarettes    Quit date: 07/18/1996    Years since quitting: 24.1  . Smokeless tobacco: Never Used  Substance Use Topics  . Alcohol use: Yes    Alcohol/week: 21.0 standard drinks    Types: 21 Glasses of wine per week    Comment: 3 glasses of wine daily  . Drug use: Yes    Comment: Phentermine, Tramadol    Home Medications Prior to Admission medications   Medication Sig Start Date End Date Taking? Authorizing Provider  busPIRone (BUSPAR)  15 MG tablet Take 15 mg by mouth 3 (three) times daily.    [provider]  docusate sodium (COLACE) 100 MG capsule Take 100 mg by mouth as needed. Reported on 01/28/2016    [provider]  HYDROcodone-acetaminophen (NORCO/VICODIN) 5-325 MG tablet Take by mouth. 01/17/16   [provider]    Allergies    Macrobid [nitrofurantoin]  Review of Systems   Review of Systems   10-point review of systems otherwise negative except as noted above in HPI.   Physical Exam Updated Vital Signs BP (!) 154/72   Pulse (!) 130   Temp 99 F (37.2 C)   Resp 19   SpO2 100%   Physical Exam   General: Patient appears uncomfortable but well. No acute distress. Eyes: Sclera non-icteric. Mild conjunctival injection bilaterally. HENT: Moist mucus membranes. No nasal  discharge. Respiratory: Lungs are CTA, bilaterally. No wheezes, rales, or rhonchi.  Cardiovascular: Rate is tachycardic. Rhythm is irregularly irregular. No murmurs, rubs, or gallops. No lower extremity edema. Neurological: Alert and oriented x 3. No tremors. Sensation to light touch intact throughout.  Musculoskeletal: Normal muscle bulk and tone.  Abdominal: Soft and non-tender to palpation. Bowel sounds intact. No rebound or guarding. Skin: No lesions. No rashes.  Psych: Patient appears anxious. Normal tone of voice.   Jeralyn Bennett, MD 09/04/2020, 11:59 PM Pager: [phone]   ED Results / Procedures / Treatments   Labs (all labs ordered are listed, but only abnormal results are displayed) Labs Reviewed  BASIC METABOLIC PANEL - Abnormal; Notable for the following components:      Result Value   Potassium 3.3 (*)    Glucose, Bld 105 (*)    All other components within normal limits  CBC  MAGNESIUM  TSH  I-STAT BETA HCG BLOOD, ED ([facility], [facility], [facility] ONLY)  TROPONIN I (HIGH SENSITIVITY)  TROPONIN I (HIGH SENSITIVITY)    EKG EKG Interpretation  Date/Time:  Friday September 04 2020 22:19:23 EST Ventricular Rate:  86 PR Interval:    QRS Duration: 85 QT Interval:  337 QTC Calculation: 403 R Axis:   47 Text Interpretation: Atrial fibrillation Confirmed by Quintella Reichert [phone]) on 09/04/2020 10:22:37 PM   Radiology DG Chest 2 View  Result Date: 09/04/2020 CLINICAL DATA:  Shortness of breath.  Atrial fibrillation. EXAM: CHEST - 2 VIEW COMPARISON:  08/01/2018 FINDINGS: Heart size is normal. Mediastinal shadows are normal. The lungs are clear. No bronchial thickening. No infiltrate, mass, effusion or collapse. Pulmonary vascularity is normal. No bony abnormality. IMPRESSION: Normal chest. Electronically Signed   By: Nelson Chimes M.D.   On: 09/04/2020 18:11    Procedures Procedures   Medications Ordered in ED Medications  potassium chloride SA (KLOR-CON) CR tablet 40 mEq (has  no administration in time range)  sodium chloride 0.9 % bolus 1,000 mL (has no administration in time range)  etomidate (AMIDATE) injection (10 mg Intravenous Given 09/04/20 2348)  diltiazem (CARDIZEM) 125 mg in dextrose 5% 125 mL (1 mg/mL) infusion (has no administration in time range)  etomidate (AMIDATE) injection 10 mg (10 mg Intravenous Given 09/04/20 2340)    ED Course  I have reviewed the triage vital signs and the nursing notes.  Pertinent labs & imaging results that were available during my care of the patient were reviewed by me and considered in my medical decision making (see chart for details).    MDM Rules/Calculators/A&P  Ms. Graley presents with A-Fib, initially in RVR although rates have improved to the 110-120's for the most part. Arrhythmia likely in the setting of phentermine use (versus idiopathic); however, will check TSH to r/o underlying thyroid disease. BMP remarkable for mild hypokalemia K+ 3.3, magnesium pending. Given high likelihood that this is new-onset A-Fib with RVR and given short duration of symptoms <48 hours, patient is a good candidate for cardioversion. Risks and benefits of the procedure discussed with patient. She is willing to undergo cardioversion.   - Gave 58mEq Klor-Con - RT consulted and arrived to room - Started NS 1L bolus  - Consented for procedure  Patient was given 10mg  IV etomidate. She was cardioverted with 100J although quickly reverted back into A-Fib with RVR. A second synchronized shock at 200J was delivered although patient maintained A-Fib with RVR.   - Confirmed with EKG - Started Diltiazem IV infusion - Placed consult to cardiology   Final Clinical Impression(s) / ED Diagnoses Final diagnoses:  Atrial fibrillation with RVR ([place])  Hypokalemia    Rx / DC Orders ED Discharge Orders    None     Jeralyn Bennett, MD 09/04/2020, 11:59 PM Pager: [phone]    Jeralyn Bennett, MD 09/04/20 2359     Maudie Flakes, MD 09/05/20 [phone]

## 2020-09-04 NOTE — ED Triage Notes (Signed)
Pt c/o left hand numbness, sensation of "hard to take a breath in" after taking phentermine at approx 1200 today.  Pt states approx 30 min after taking phentermine, that she experience "weird sensation in her head, dizziness". She states she took tramadol this morning, then discovered that tramadol and phentermine have interactions.   S1S2 irreg, irreg rhythm and rate.   Denies CP, pain to back, jaw, left arm,  Grips equal, strong; smile symmetrical; no arm drift.  EKG performed and given to M. Freida Busman, [place] who arrived at [facility] for evaluation and recommended pt be transported to ER via EMS.

## 2020-09-05 ENCOUNTER — Observation Stay (HOSPITAL_COMMUNITY): Payer: BLUE CROSS/BLUE SHIELD

## 2020-09-05 ENCOUNTER — Other Ambulatory Visit: Payer: Self-pay | Admitting: Cardiology

## 2020-09-05 DIAGNOSIS — E876 Hypokalemia: Secondary | ICD-10-CM | POA: Diagnosis not present

## 2020-09-05 DIAGNOSIS — F1599 Other stimulant use, unspecified with unspecified stimulant-induced disorder: Secondary | ICD-10-CM

## 2020-09-05 DIAGNOSIS — U071 COVID-19: Secondary | ICD-10-CM | POA: Diagnosis present

## 2020-09-05 DIAGNOSIS — I4891 Unspecified atrial fibrillation: Secondary | ICD-10-CM | POA: Diagnosis present

## 2020-09-05 DIAGNOSIS — Z8659 Personal history of other mental and behavioral disorders: Secondary | ICD-10-CM

## 2020-09-05 DIAGNOSIS — I480 Paroxysmal atrial fibrillation: Secondary | ICD-10-CM

## 2020-09-05 LAB — BASIC METABOLIC PANEL
Anion gap: 11 (ref 5–15)
BUN: 10 mg/dL (ref 6–20)
CO2: 21 mmol/L — ABNORMAL LOW (ref 22–32)
Calcium: 9 mg/dL (ref 8.9–10.3)
Chloride: 106 mmol/L (ref 98–111)
Creatinine, Ser: 0.86 mg/dL (ref 0.44–1.00)
GFR, Estimated: >60 mL/min (ref >60)
Glucose, Bld: 96 mg/dL (ref 70–99)
Potassium: 3.9 mmol/L (ref 3.5–5.1)
Sodium: 138 mmol/L (ref 135–145)

## 2020-09-05 LAB — MAGNESIUM: Magnesium: 1.7 mg/dL (ref 1.7–2.4)

## 2020-09-05 LAB — TSH: TSH: 5.485 u[IU]/mL — ABNORMAL HIGH (ref 0.350–4.500)

## 2020-09-05 LAB — HIV ANTIBODY (ROUTINE TESTING W REFLEX): HIV Screen 4th Generation wRfx: NONREACTIVE

## 2020-09-05 LAB — SARS CORONAVIRUS 2 (TAT 6-24 HRS): SARS Coronavirus 2: POSITIVE — AB

## 2020-09-05 MED ORDER — MAGNESIUM SULFATE 2 GM/50ML IV SOLN
2.0000 g | Freq: Once | INTRAVENOUS | Status: AC
  Administered 2020-09-05: 2 g via INTRAVENOUS
  Filled 2020-09-05: qty 50

## 2020-09-05 MED ORDER — DILTIAZEM HCL ER COATED BEADS 120 MG PO CP24: 120.0000 mg | ORAL_CAPSULE | Freq: Every day | ORAL | 2 refills | Status: DC

## 2020-09-05 MED ORDER — POTASSIUM CHLORIDE CRYS ER 20 MEQ PO TBCR
40.0000 meq | EXTENDED_RELEASE_TABLET | Freq: Once | ORAL | Status: AC
  Administered 2020-09-05: 40 meq via ORAL
  Filled 2020-09-05: qty 2

## 2020-09-05 MED ORDER — DILTIAZEM HCL-DEXTROSE 125-5 MG/125ML-% IV SOLN (PREMIX)
5.0000 mg/h | INTRAVENOUS | Status: DC
  Administered 2020-09-05: 7.5 mg/h via INTRAVENOUS

## 2020-09-05 MED ORDER — ONDANSETRON HCL 4 MG/2ML IJ SOLN: 4.0000 mg | Freq: Four times a day (QID) | INTRAMUSCULAR | Status: DC | PRN

## 2020-09-05 MED ORDER — DILTIAZEM HCL ER COATED BEADS 120 MG PO CP24
120.0000 mg | ORAL_CAPSULE | Freq: Every day | ORAL | Status: DC
Start: 2020-09-05 — End: 2020-09-05
  Administered 2020-09-05: 120 mg via ORAL
  Filled 2020-09-05: qty 1

## 2020-09-05 MED ORDER — ENOXAPARIN SODIUM 40 MG/0.4ML ~~LOC~~ SOLN
40.0000 mg | Freq: Every day | SUBCUTANEOUS | Status: DC
  Administered 2020-09-05: 40 mg via SUBCUTANEOUS
  Filled 2020-09-05: qty 0.4

## 2020-09-05 MED ORDER — ACETAMINOPHEN 325 MG PO TABS: 650.0000 mg | ORAL_TABLET | ORAL | Status: DC | PRN

## 2020-09-05 NOTE — H&P (Signed)
History and Physical    Kim Irwin WSF:[id number] DOB: [birth year] DOA: 09/04/2020  PCP: Everardo Beals, NP  Outpatient Specialists: none Patient coming from: home, sent by urgent care  Chief Complaint: palpitations, sent by urgent care for Afib RVR  HPI: Kim Irwin is a 55 y.o. female with medical history significant of anxiety.   Presenting to ED for acute onset SOP, left hand numbness/tingling, palpitations.  Started after she took phentermine, also took tramadol this morning.  Denies history of cardiac problems other than vague history of "abnormal heartbeat" when she was in middle school which apparently resolved on its own.  Patient sought care at urgent care, was sent here for A. fib/RVR.  ED Course: Cardioversion attempt x2 in the ER, attempt #1-lead to sinus rhythm for a few seconds, attempt #2 did nothing to change atrial for bit.  Has been stable on Cardizem drip with heart rate 110s to 130.  ED called cardiology, they stated since likely due to medication effect as result of stimulant use, would discontinue medication of course and see if she is able to convert on her own  Review of Systems: As per HPI otherwise 10 point review of systems negative.  At time of interview when I saw her in the emergency department, tachycardic but not complaining of any palpitations.   Past Medical History:  Diagnosis Date  . Allergy    macrobid  . Anxiety   . Arthritis    hands  . GERD (gastroesophageal reflux disease)   . Hearing loss    slight, no hearing aids  . Migraine    otc med prn  . Pneumonia    hx - resolved  . SVD (spontaneous vaginal delivery)    x 1  . Urinary tract infection    Hx - resolved    Past Surgical History:  Procedure Laterality Date  . COLONOSCOPY  09/2003   Normal - [facility]  . endometrial ablation    . TUBAL LIGATION    . WRIST SURGERY Right      reports that she quit smoking about 24 years ago. Her smoking use included cigarettes. She  has a 3.00 pack-year smoking history. She has never used smokeless tobacco. She reports current alcohol use of about 21.0 standard drinks of alcohol per week. She reports current drug use.  Allergies  Allergen Reactions  . Macrobid [Nitrofurantoin] Other (See Comments)    Flu like symptoms    Family History  Problem Relation Age of Onset  . Hyperlipidemia Father   . Asthma Father   . Other Mother   . Heart disease Mother   . Stomach cancer Paternal Grandfather   . Colon cancer Neg Hx   . Esophageal cancer Neg Hx   . Rectal cancer Neg Hx   . Breast cancer Neg Hx      Prior to Admission medications   Medication Sig Start Date End Date Taking? Authorizing Provider  BIOTIN PO Take 2 tablets by mouth daily. gummy   Yes [provider]  phentermine 37.5 MG capsule Take 37.5 mg by mouth daily.   Yes [provider]  vitamin C (ASCORBIC ACID) 500 MG tablet Take 3,000 mg by mouth daily.   Yes [provider]    Physical Exam: Vitals:   09/05/20 0030 09/05/20 0045 09/05/20 0100 09/05/20 0115  BP: (!) 148/97 (!) 142/107 (!) 131/99 (!) 125/99  Pulse: (!) 101 65 95 97  Resp: (!) 25 14 19  (!) 22  Temp:  TempSrc:      SpO2: 100% 100% 100% 100%      Constitutional: NAD, calm, comfortable Vitals:   09/05/20 0030 09/05/20 0045 09/05/20 0100 09/05/20 0115  BP: (!) 148/97 (!) 142/107 (!) 131/99 (!) 125/99  Pulse: (!) 101 65 95 97  Resp: (!) 25 14 19  (!) 22  Temp:      TempSrc:      SpO2: 100% 100% 100% 100%   Eyes: PERRL, lids and conjunctivae normal ENMT: Mucous membranes are moist.  Neck: normal, supple, no masses, no thyromegaly Respiratory: clear to auscultation bilaterally, no wheezing, no crackles. Normal respiratory effort. No accessory muscle use.  Cardiovascular: Rate tachycardic, irregular/irregular, no murmurs / rubs / gallops. No extremity edema. 2+ pedal pulses. No carotid bruits.  Abdomen: no tenderness, no masses palpated. No  hepatosplenomegaly. Bowel sounds positive.  Musculoskeletal: no clubbing / cyanosis. No joint deformity upper and lower extremities. Good ROM, no contractures. Normal muscle tone.  Skin: no rashes, lesions, ulcers. No induration Neurologic: CN 2-12 grossly intact. Sensation intact, DTR normal. Strength 5/5 in all 4.  Psychiatric: Normal judgment and insight. Alert and oriented x 3. Normal mood.     Labs on Admission: I have personally reviewed following labs and imaging studies  CBC: Recent Labs  Lab 09/04/20 1750  WBC 6.1  HGB 13.2  HCT 38.4  MCV 92.3  PLT 732   Basic Metabolic Panel: Recent Labs  Lab 09/04/20 1750 09/05/20 0010  NA 139  --   K 3.3*  --   CL 105  --   CO2 23  --   GLUCOSE 105*  --   BUN 13  --   CREATININE 0.84  --   CALCIUM 9.5  --   MG  --  1.7   GFR: CrCl cannot be calculated (Unknown ideal weight.). Liver Function Tests: No results for input(s): AST, ALT, ALKPHOS, BILITOT, PROT, ALBUMIN in the last 168 hours. No results for input(s): LIPASE, AMYLASE in the last 168 hours. No results for input(s): AMMONIA in the last 168 hours. Coagulation Profile: No results for input(s): INR, PROTIME in the last 168 hours. Cardiac Enzymes: No results for input(s): CKTOTAL, CKMB, CKMBINDEX, TROPONINI in the last 168 hours. BNP (last 3 results) No results for input(s): PROBNP in the last 8760 hours. HbA1C: No results for input(s): HGBA1C in the last 72 hours. CBG: No results for input(s): GLUCAP in the last 168 hours. Lipid Profile: No results for input(s): CHOL, HDL, LDLCALC, TRIG, CHOLHDL, LDLDIRECT in the last 72 hours. Thyroid Function Tests: Recent Labs    09/05/20 0010  TSH 5.485*   Anemia Panel: No results for input(s): VITAMINB12, FOLATE, FERRITIN, TIBC, IRON, RETICCTPCT in the last 72 hours. Urine analysis:    Component Value Date/Time   COLORURINE YELLOW 02/18/2008 0039   APPEARANCEUR CLEAR 02/18/2008 0039   LABSPEC 1.025 01/02/2012 1251    PHURINE 6.0 01/02/2012 1251   GLUCOSEU NEGATIVE 01/02/2012 1251   HGBUR TRACE (A) 01/02/2012 1251   BILIRUBINUR NEGATIVE 01/02/2012 1251   BILIRUBINUR negative 09/14/2011 0852   KETONESUR NEGATIVE 01/02/2012 1251   PROTEINUR NEGATIVE 01/02/2012 1251   UROBILINOGEN 0.2 01/02/2012 1251   NITRITE NEGATIVE 01/02/2012 1251   LEUKOCYTESUR NEGATIVE 01/02/2012 1251   Sepsis Labs: @LABRCNTIP (procalcitonin:4,lacticidven:4) )No results found for this or any previous visit (from the past 240 hour(s)).   Radiological Exams on Admission: DG Chest 2 View  Result Date: 09/04/2020 CLINICAL DATA:  Shortness of breath.  Atrial fibrillation. EXAM: CHEST -  2 VIEW COMPARISON:  08/01/2018 FINDINGS: Heart size is normal. Mediastinal shadows are normal. The lungs are clear. No bronchial thickening. No infiltrate, mass, effusion or collapse. Pulmonary vascularity is normal. No bony abnormality. IMPRESSION: Normal chest. Electronically Signed   By: Nelson Chimes M.D.   On: 09/04/2020 18:11    EKG: Independently reviewed.  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 --> If patient not converting to sinus rhythm, would strongly consider reconsult cardiology versus sent home on anticoagulation to follow-up as an outpatient  Abn TSH Unlikely significant, TSH high = no thyroid storm/hyperthyroid effect --> would f/u outpatient  Hypokalemia, mild --> AM labs , replete as neede     DVT prophylaxis: started on Xarelto presuming Afib may not resolve, can revisit this. Pt ambulatory  Code Status: FULL Family Communication: husband at bedside   Disposition Plan: home  Consults called: ED called cardiology  Admission status: inpatient   Emeterio Reeve MD [facility]   If 7PM-7AM, please contact night-coverage [email or web address]   09/05/2020, 1:47 AM

## 2020-09-05 NOTE — Progress Notes (Signed)
Patient seen and examined. Husband at the bedside . Admitted early morning hours with medication induced Afib .   Cardio recommended amio, looks like not started due to conversion to sinus rhythm.  She is asymptomatic now on 5 mg/hour cardizem drip and in sinus rhythm.  Plan: start Cardizem CD 120 mg and stop drip if HR <90 in one hour  Mobilize with monitor in the floor. TSH 5, no indication for treatment  Echo ordered , none present in the past. Anticoagulation initiation pending, will await cardiology plan. If she mobilizes and remains asymptomatic sinus rhythm, anticipate dc home later today.

## 2020-09-05 NOTE — ED Provider Notes (Addendum)
.Sedation  Date/Time: 09/05/2020 12:14 AM Performed by: Maudie Flakes, MD Authorized by: Maudie Flakes, MD   Consent:    Consent obtained:  Verbal and written   Consent given by:  Patient   Risks discussed:  Allergic reaction, dysrhythmia, inadequate sedation, nausea, prolonged hypoxia resulting in organ damage and respiratory compromise necessitating ventilatory assistance and intubation Universal protocol:    Immediately prior to procedure, a time out was called: yes     Patient identity confirmed:  Verbally with patient Indications:    Procedure performed:  Cardioversion   Procedure necessitating sedation performed by:  Physician performing sedation Pre-sedation assessment:    Time since last food or drink:  5 hours   ASA classification: class 1 - normal, healthy patient     Mouth opening:  3 or more finger widths   Mallampati score:  I - soft palate, uvula, fauces, pillars visible   Neck mobility: normal     Pre-sedation assessments completed and reviewed: airway patency, cardiovascular function, hydration status, mental status, nausea/vomiting, pain level, respiratory function and temperature   Immediate pre-procedure details:    Reviewed: vital signs, relevant labs/tests and NPO status     Verified: bag valve mask available, emergency equipment available, intubation equipment available, IV patency confirmed, oxygen available and suction available   Procedure details (see MAR for exact dosages):    Preoxygenation:  Nasal cannula   Sedation:  Etomidate   Intended level of sedation: deep   Intra-procedure monitoring:  Blood pressure monitoring, cardiac monitor, continuous capnometry, continuous pulse oximetry, frequent vital sign checks and frequent LOC assessments   Intra-procedure events: none     Total Provider sedation time (minutes):  18 Post-procedure details:    Attendance: Constant attendance by certified staff until patient recovered     Recovery: Patient returned to  pre-procedure baseline     Post-sedation assessments completed and reviewed: airway patency, cardiovascular function, hydration status, mental status, nausea/vomiting, pain level, respiratory function and temperature     Patient is stable for discharge or admission: yes     Procedure completion:  Tolerated well, no immediate complications .Cardioversion  Date/Time: 09/05/2020 12:15 AM Performed by: Maudie Flakes, MD Authorized by: Maudie Flakes, MD   Consent:    Consent obtained:  Verbal and written   Consent given by:  Patient   Risks discussed:  Cutaneous burn, death, induced arrhythmia and pain   Alternatives discussed:  Rate-control medication Pre-procedure details:    Cardioversion basis:  Elective   Rhythm:  Atrial fibrillation   Electrode placement:  Anterior-posterior Patient sedated: Yes. Refer to sedation procedure documentation for details of sedation.  Attempt one:    Cardioversion mode:  Synchronous   Waveform:  Biphasic   Shock (Joules):  100   Cardioversion outcome attempt one: Brief conversion to sinus rhythm followed by return to A. fib with RVR. Attempt two:    Cardioversion mode:  Synchronous   Waveform:  Biphasic   Shock (Joules):  120   Shock outcome:  No change in rhythm Post-procedure details:    Patient status:  Awake   Patient tolerance of procedure:  Tolerated well, no immediate complications Comments:     Unsuccessful cardioversion, persistent A. fib with RVR.   Marland KitchenCritical Care Performed by: Maudie Flakes, MD Authorized by: Maudie Flakes, MD   Critical care provider statement:    Critical care time (minutes):  45   Critical care was necessary to treat or prevent imminent or life-threatening deterioration  of the following conditions: A. fib with RVR.   Critical care was time spent personally by me on the following activities:  Discussions with consultants, evaluation of patient's response to treatment, examination of patient, ordering and  performing treatments and interventions, ordering and review of laboratory studies, ordering and review of radiographic studies, pulse oximetry, re-evaluation of patient's condition, obtaining history from patient or surrogate and review of old charts      Eleni Frank, Barth Kirks, MD 09/05/20 0017    Maudie Flakes, MD 09/05/20 [phone]

## 2020-09-05 NOTE — ED Notes (Signed)
Tele  Breakfast Ordered 

## 2020-09-05 NOTE — Discharge Summary (Signed)
Physician Discharge Summary  Kim Irwin TIR:[id number] DOB: [birth year] DOA: 09/04/2020  PCP: Everardo Beals, NP  Admit date: 09/04/2020 Discharge date: 09/05/2020  Admitted From: home  Disposition:  Home   Recommendations for Outpatient Follow-up:  1. Follow up with PCP in 1-2 weeks 2. Cardiology will schedule follow-up.  Call cardiology office if you do not hear from them.  Home Health: Not applicable Equipment/Devices: Not applicable  Discharge Condition: Stable CODE STATUS: Full code Diet recommendation: Regular diet  Discharge summary: 55 year old female with history of anxiety, took 1 dose of tramadol in the morning for neck pain, took a dose of phentermine in the afternoon that was her old medicine and about 30 minutes after that he started having left hand numbness, sensation of difficulty breathing and palpitation.  Went to urgent care and found to be with A. fib and RVR and sent to ER.  In the emergency room, A. fib with RVR, hemodynamically stable with ventricular rate more than 150.  Thought to be new onset A. fib, attempted cardioversion x2 with sedation and unsuccessful.  Rapid A. Fib: Now sinus rhythm. Reportedly new onset.  Probably aggravated by use of tramadol along with stimulant like phentermine. Cardioversion x2, unsuccessful. Cardizem infusion and spontaneously converted to sinus rhythm and now with regular rate and rhythm. Started on Cardizem 120 mg daily, mobilized around with no recurrence of symptoms or A. Fib. 2D echocardiogram ordered, patient did not want to wait in the hospital for that, she wants to go home and with outpatient. Anticoagulation discussed, CHA2DS2-VASc 2 score of 1.  Patient declined. Outpatient follow-up with cardiology to determine A. fib burden and monitoring as well as echocardiogram in 2 weeks. Patient is asymptomatic, walked around and eager to go home. She is decided not to take any medications that are not prescribed to her.   Will decrease caffeine intake and other stimulants.  COVID-19 infection: As part of the screening test, she was found to be positive for COVID-19 infection. Patient denies any fever, chills.  She had fatigue and sensation of shortness of breath however had rapid A. fib at that time.  Currently on room air. Diagnosis discussed and explained to the patient, symptom management and isolation for 1 week discussed. Patient does not believe she has any infection, however we discussed that she has asymptomatic infection and she needs to be carefully monitoring her symptoms and take appropriate precautions and report for any worsening symptoms. Patient has not taken COVID-19 vaccine.  Not ready to discuss today.   Patient is currently stable and asymptomatic and eager to go home.    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[place]) likely cause of AFIb RVR   History of anxiety   COVID-19 virus infection    Discharge Instructions  Discharge Instructions    Amb referral to AFIB Clinic   Complete by: As directed    Call MD for:  difficulty breathing, headache or visual disturbances   Complete by: As directed    Call MD for:  persistant dizziness or light-headedness   Complete by: As directed    Call MD for:  temperature >100.4   Complete by: As directed    Diet - low sodium heart healthy   Complete by: As directed    Discharge instructions   Complete by: As directed    Isolation for 7 days advised for asymptomatic covid  infection.   Increase activity slowly   Complete by: As directed      Allergies as of 09/05/2020      Reactions   Macrobid [nitrofurantoin] Other (See Comments)   Flu like symptoms      Medication List    STOP taking these medications   BIOTIN PO   phentermine 37.5 MG capsule   vitamin C 500 MG tablet Commonly  known as: ASCORBIC ACID     TAKE these medications   diltiazem 120 MG 24 hr capsule Commonly known as: CARDIZEM CD Take 1 capsule (120 mg total) by mouth daily. Start taking on: September 06, 2020       Follow-up Information    Donato Heinz, MD Follow up.   Specialties: Cardiology, Radiology Why: his office will call to arrange follow up and echocardiogram Contact information: [place] [id number] [phone]              Allergies  Allergen Reactions  . Macrobid [Nitrofurantoin] Other (See Comments)    Flu like symptoms    Consultations:  Cardiology   Procedures/Studies: DG Chest 2 View  Result Date: 09/04/2020 CLINICAL DATA:  Shortness of breath.  Atrial fibrillation. EXAM: CHEST - 2 VIEW COMPARISON:  08/01/2018 FINDINGS: Heart size is normal. Mediastinal shadows are normal. The lungs are clear. No bronchial thickening. No infiltrate, mass, effusion or collapse. Pulmonary vascularity is normal. No bony abnormality. IMPRESSION: Normal chest. Electronically Signed   By: Nelson Chimes M.D.   On: 09/04/2020 18:11   (Echo, Carotid, EGD, Colonoscopy, ERCP)    Subjective: Patient seen and examined in the morning rounds.  She had no complaints.  Husband was at the bedside.  Was on minimum Cardizem drip, we gave her dose of oral Cardizem and walked around with no recurrence of A. fib or any symptoms. I asked to get 2D echocardiogram done before discharge later today, however she wants to go home and do it next week as outpatient.   Discharge Exam: Vitals:   09/05/20 0811 09/05/20 1200  BP: 115/68   Pulse:    Resp:  16  Temp:    SpO2:  99%   Vitals:   09/05/20 0615 09/05/20 0630 09/05/20 0811 09/05/20 1200  BP: 108/89 121/84 115/68   Pulse: 77 71    Resp: 14 17  16   Temp:  98.4 F (36.9 C)    TempSrc:  Oral    SpO2: 97% 100%  99%  Weight:  67.7 kg    Height:  5\' 8"  (1.727 m)      General: Pt is alert, awake, not in  acute distress Cardiovascular: RRR, S1/S2 +, no rubs, no gallops Respiratory: CTA bilaterally, no wheezing, no rhonchi Abdominal: Soft, NT, ND, bowel sounds + Extremities: no edema, no cyanosis    The results of significant diagnostics from this hospitalization (including imaging, microbiology, ancillary and laboratory) are listed below for reference.     Microbiology: Recent Results (from the past 240 hour(s))  SARS CORONAVIRUS 2 (TAT 6-24 HRS) Nasopharyngeal Nasopharyngeal Swab     Status: Abnormal   Collection Time: 09/05/20  5:13 AM   Specimen: Nasopharyngeal Swab  Result Value Ref Range Status   SARS Coronavirus 2 POSITIVE (A) NEGATIVE Final    Comment: (NOTE) SARS-CoV-2 target nucleic acids are DETECTED.  The SARS-CoV-2 RNA is generally detectable in upper and lower respiratory specimens during the acute phase of infection. Positive results are indicative of the presence of SARS-CoV-2  RNA. Clinical correlation with patient history and other diagnostic information is  necessary to determine patient infection status. Positive results do not rule out bacterial infection or co-infection with other viruses.  The expected result is Negative.  Fact Sheet for Patients: SugarRoll.be  Fact Sheet for Healthcare Providers: https://www.woods-mathews.com/  This test is not yet approved or cleared by the [place] FDA and  has been authorized for detection and/or diagnosis of SARS-CoV-2 by FDA under an Emergency Use Authorization (EUA). This EUA will remain  in effect (meaning this test can be used) for the duration of the COVID-19 declaration under Section 564(b)(1) of the Act, 21 U. S.C. section 360bbb-3(b)(1), unless the authorization is terminated or revoked sooner.   Performed at [facility], [place] [address], [place], [place] [address]      Labs: BNP (last 3 results) No results for input(s): BNP in the last 8760  hours. Basic Metabolic Panel: Recent Labs  Lab 09/04/20 1750 09/05/20 0010  NA 139 138  K 3.3* 3.9  CL 105 106  CO2 23 21*  GLUCOSE 105* 96  BUN 13 10  CREATININE 0.84 0.86  CALCIUM 9.5 9.0  MG  --  1.7   Liver Function Tests: No results for input(s): AST, ALT, ALKPHOS, BILITOT, PROT, ALBUMIN in the last 168 hours. No results for input(s): LIPASE, AMYLASE in the last 168 hours. No results for input(s): AMMONIA in the last 168 hours. CBC: Recent Labs  Lab 09/04/20 1750  WBC 6.1  HGB 13.2  HCT 38.4  MCV 92.3  PLT 269   Cardiac Enzymes: No results for input(s): CKTOTAL, CKMB, CKMBINDEX, TROPONINI in the last 168 hours. BNP: Invalid input(s): POCBNP CBG: No results for input(s): GLUCAP in the last 168 hours. D-Dimer No results for input(s): DDIMER in the last 72 hours. Hgb A1c No results for input(s): HGBA1C in the last 72 hours. Lipid Profile No results for input(s): CHOL, HDL, LDLCALC, TRIG, CHOLHDL, LDLDIRECT in the last 72 hours. Thyroid function studies Recent Labs    09/05/20 0010  TSH 5.485*   Anemia work up No results for input(s): VITAMINB12, FOLATE, FERRITIN, TIBC, IRON, RETICCTPCT in the last 72 hours. Urinalysis    Component Value Date/Time   COLORURINE YELLOW 02/18/2008 0039   APPEARANCEUR CLEAR 02/18/2008 0039   LABSPEC 1.025 01/02/2012 1251   PHURINE 6.0 01/02/2012 1251   GLUCOSEU NEGATIVE 01/02/2012 1251   HGBUR TRACE (A) 01/02/2012 1251   BILIRUBINUR NEGATIVE 01/02/2012 1251   BILIRUBINUR negative 09/14/2011 0852   KETONESUR NEGATIVE 01/02/2012 1251   PROTEINUR NEGATIVE 01/02/2012 1251   UROBILINOGEN 0.2 01/02/2012 1251   NITRITE NEGATIVE 01/02/2012 1251   LEUKOCYTESUR NEGATIVE 01/02/2012 1251   Sepsis Labs Invalid input(s): PROCALCITONIN,  WBC,  LACTICIDVEN Microbiology Recent Results (from the past 240 hour(s))  SARS CORONAVIRUS 2 (TAT 6-24 HRS) Nasopharyngeal Nasopharyngeal Swab     Status: Abnormal   Collection Time: 09/05/20   5:13 AM   Specimen: Nasopharyngeal Swab  Result Value Ref Range Status   SARS Coronavirus 2 POSITIVE (A) NEGATIVE Final    Comment: (NOTE) SARS-CoV-2 target nucleic acids are DETECTED.  The SARS-CoV-2 RNA is generally detectable in upper and lower respiratory specimens during the acute phase of infection. Positive results are indicative of the presence of SARS-CoV-2 RNA. Clinical correlation with patient history and other diagnostic information is  necessary to determine patient infection status. Positive results do not rule out bacterial infection or co-infection with other viruses.  The expected result is  Negative.  Fact Sheet for Patients: SugarRoll.be  Fact Sheet for Healthcare Providers: https://www.woods-mathews.com/  This test is not yet approved or cleared by the [place] FDA and  has been authorized for detection and/or diagnosis of SARS-CoV-2 by FDA under an Emergency Use Authorization (EUA). This EUA will remain  in effect (meaning this test can be used) for the duration of the COVID-19 declaration under Section 564(b)(1) of the Act, 21 U. S.C. section 360bbb-3(b)(1), unless the authorization is terminated or revoked sooner.   Performed at [facility], [place] [address] [place], [place] [address]      Time coordinating discharge:  32 minutes  SIGNED:   Barb Merino, MD  [facility] 09/05/2020, 1:15 PM

## 2020-09-05 NOTE — Progress Notes (Signed)
Spoke with patient.  Agree with plan per Dr Alfred Levins.  She is a 1F with GERD who presented with new onset Afib with RVR after taking phentermine and tramadol.  Failed DCCV x2 in the ED.  She was started on IV amiodarone and diltiazem.  CHADS-VASC 1, she was started on heparin gtt.  Found to be COVID positive.  Converted to NSR overnight.  Switch to PO diltiazem and discontinue IV dittiazem. Spoke with patient, recommended echocardiogram and monitoring and discharge later this afternoon.  She declines waiting for echo, wants to leave immediately.  Will schedule OP f/u and echo.  Spoke with her that [facility] optional given CHADS-VASC 1, she would like to hold off on anticoagulation.  Recommended discontinuing phentermine, she is agreeable to this.  Donato Heinz, MD

## 2020-09-05 NOTE — H&P (Incomplete)
History and Physical    Kim Irwin RJJ:[id number] DOB: 1965/09/14 DOA: 09/04/2020   PCP: Everardo Beals, NP Outpatient Specialists: none  Patient coming from: home, sent to ED by urgent care   Chief Complaint: palpitations   HPI: Kim Irwin is a 55 y.o. female with no significant medical history other than anxiety.   ED Course:   Review of Systems: As per HPI otherwise 10 point review of systems negative.    Past Medical History:  Diagnosis Date  . Allergy    macrobid  . Anxiety   . Arthritis    hands  . GERD (gastroesophageal reflux disease)   . Hearing loss    slight, no hearing aids  . Migraine    otc med prn  . Pneumonia    hx - resolved  . SVD (spontaneous vaginal delivery)    x 1  . Urinary tract infection    Hx - resolved    Past Surgical History:  Procedure Laterality Date  . COLONOSCOPY  09/2003   Normal - [facility]  . endometrial ablation    . TUBAL LIGATION    . WRIST SURGERY Right      reports that she quit smoking about 24 years ago. Her smoking use included cigarettes. She has a 3.00 pack-year smoking history. She has never used smokeless tobacco. She reports current alcohol use of about 21.0 standard drinks of alcohol per week. She reports current drug use.  Allergies  Allergen Reactions  . Macrobid [Nitrofurantoin] Other (See Comments)    Flu like symptoms    Family History  Problem Relation Age of Onset  . Hyperlipidemia Father   . Asthma Father   . Other Mother   . Heart disease Mother   . Stomach cancer Paternal Grandfather   . Colon cancer Neg Hx   . Esophageal cancer Neg Hx   . Rectal cancer Neg Hx   . Breast cancer Neg Hx    Unacceptable: Noncontributory, unremarkable, or negative. Acceptable: Family history reviewed and not pertinent (If you reviewed it)  Prior to Admission medications   Medication Sig Start Date End Date Taking? Authorizing Provider  busPIRone (BUSPAR) 15 MG tablet Take 15 mg by mouth 3 (three)  times daily.    [provider]  docusate sodium (COLACE) 100 MG capsule Take 100 mg by mouth as needed. Reported on 01/28/2016    [provider]  HYDROcodone-acetaminophen (NORCO/VICODIN) 5-325 MG tablet Take by mouth. 01/17/16   [provider]    Physical Exam: Vitals:   09/04/20 2354 09/04/20 2357 09/05/20 0000 09/05/20 0003  BP: (!) 169/114 (!) 154/72 (!) 147/98 (!) 140/111  Pulse: (!) 147 (!) 130 (!) 134 (!) 107  Resp: 17 19 16 13   Temp:      TempSrc:      SpO2: 92% 100% 100% 100%      Constitutional: NAD, calm, comfortable Vitals:   09/04/20 2354 09/04/20 2357 09/05/20 0000 09/05/20 0003  BP: (!) 169/114 (!) 154/72 (!) 147/98 (!) 140/111  Pulse: (!) 147 (!) 130 (!) 134 (!) 107  Resp: 17 19 16 13   Temp:      TempSrc:      SpO2: 92% 100% 100% 100%   Eyes: PERRL, lids and conjunctivae normal ENMT: Mucous membranes are moist. Posterior pharynx clear of any exudate or lesions.Normal dentition.  Neck: normal, supple, no masses, no thyromegaly Respiratory: clear to auscultation bilaterally, no wheezing, no crackles. Normal respiratory effort. No accessory muscle use.  Cardiovascular: Regular rate and rhythm, no murmurs / rubs / gallops. No extremity edema. 2+ pedal pulses. No carotid bruits.  Abdomen: no tenderness, no masses palpated. No hepatosplenomegaly. Bowel sounds positive.  Musculoskeletal: no clubbing / cyanosis. No joint deformity upper and lower extremities. Good ROM, no contractures. Normal muscle tone.  Skin: no rashes, lesions, ulcers. No induration Neurologic: CN 2-12 grossly intact. Sensation intact, DTR normal. Strength 5/5 in all 4.  Psychiatric: Normal judgment and insight. Alert and oriented x 3. Normal mood.   (Anything < 9 systems with 2 bullets each down codes to level 1) (If patient refuses exam can't bill higher level) (Make sure to document decubitus ulcers present on admission -- if possible -- and whether patient has  chronic indwelling catheter at time of admission)  Labs on Admission: I have personally reviewed following labs and imaging studies  CBC: Recent Labs  Lab 09/04/20 1750  WBC 6.1  HGB 13.2  HCT 38.4  MCV 92.3  PLT 416   Basic Metabolic Panel: Recent Labs  Lab 09/04/20 1750  NA 139  K 3.3*  CL 105  CO2 23  GLUCOSE 105*  BUN 13  CREATININE 0.84  CALCIUM 9.5   GFR: CrCl cannot be calculated (Unknown ideal weight.). Liver Function Tests: No results for input(s): AST, ALT, ALKPHOS, BILITOT, PROT, ALBUMIN in the last 168 hours. No results for input(s): LIPASE, AMYLASE in the last 168 hours. No results for input(s): AMMONIA in the last 168 hours. Coagulation Profile: No results for input(s): INR, PROTIME in the last 168 hours. Cardiac Enzymes: No results for input(s): CKTOTAL, CKMB, CKMBINDEX, TROPONINI in the last 168 hours. BNP (last 3 results) No results for input(s): PROBNP in the last 8760 hours. HbA1C: No results for input(s): HGBA1C in the last 72 hours. CBG: No results for input(s): GLUCAP in the last 168 hours. Lipid Profile: No results for input(s): CHOL, HDL, LDLCALC, TRIG, CHOLHDL, LDLDIRECT in the last 72 hours. Thyroid Function Tests: No results for input(s): TSH, T4TOTAL, FREET4, T3FREE, THYROIDAB in the last 72 hours. Anemia Panel: No results for input(s): VITAMINB12, FOLATE, FERRITIN, TIBC, IRON, RETICCTPCT in the last 72 hours. Urine analysis:    Component Value Date/Time   COLORURINE YELLOW 02/18/2008 0039   APPEARANCEUR CLEAR 02/18/2008 0039   LABSPEC 1.025 01/02/2012 1251   PHURINE 6.0 01/02/2012 1251   GLUCOSEU NEGATIVE 01/02/2012 1251   HGBUR TRACE (A) 01/02/2012 1251   BILIRUBINUR NEGATIVE 01/02/2012 1251   BILIRUBINUR negative 09/14/2011 0852   KETONESUR NEGATIVE 01/02/2012 1251   PROTEINUR NEGATIVE 01/02/2012 1251   UROBILINOGEN 0.2 01/02/2012 1251   NITRITE NEGATIVE 01/02/2012 1251   LEUKOCYTESUR NEGATIVE 01/02/2012 1251   Sepsis  Labs: @LABRCNTIP (procalcitonin:4,lacticidven:4) )No results found for this or any previous visit (from the past 240 hour(s)).   Radiological Exams on Admission: DG Chest 2 View  Result Date: 09/04/2020 CLINICAL DATA:  Shortness of breath.  Atrial fibrillation. EXAM: CHEST - 2 VIEW COMPARISON:  08/01/2018 FINDINGS: Heart size is normal. Mediastinal shadows are normal. The lungs are clear. No bronchial thickening. No infiltrate, mass, effusion or collapse. Pulmonary vascularity is normal. No bony abnormality. IMPRESSION: Normal chest. Electronically Signed   By: Nelson Chimes M.D.   On: 09/04/2020 18:11    EKG: Independently reviewed. ***  Assessment/Plan Active Problems:   Atrial fibrillation with RVR (HCC)  (please populate well all problems here in Problem List. (For example, if patient is on BP meds at home and you resume or decide to hold them,  it is a problem that needs to be her. Same for CAD, COPD, HLD and so on)   ***   DVT prophylaxis: *** (Lovenox/Heparin/SCD's/anticoagulated/None (if comfort care) Code Status: *** (Full/Partial (specify details) Family Communication: *** (Specify name, relationship. Do not write "discussed with patient". Specify tel # if discussed over the phone) Disposition Plan: *** (specify when and where you expect patient to be discharged) Consults called: *** (with names) Admission status: *** (inpatient / obs / tele / medical floor / SDU)  Severity of Illness: {Observation/Inpatient:21159}   Emeterio Reeve MD [facility] Pager [phone]  If 7PM-7AM, please contact night-coverage [email or web address] Password [facility]  09/05/2020, 12:43 AM

## 2020-09-05 NOTE — Consult Note (Signed)
Cardiology Consultation:  Patient ID: [name] MRN: [id number]; DOB: 1966-04-01  Admit date: 09/04/2020 Date of Consult: 09/05/2020  Primary Care Provider: Everardo Beals, NP Primary Cardiologist: No primary care provider on file.  Primary Electrophysiologist:  None   Patient Profile:  Kim Irwin is a 55 y.o. female with a hx of GERD and migraine who is being seen today for the evaluation of atrial fibrillation at the request of ER.  History of Present Illness:  Ms. Kim Irwin presents with heart palpitations. Pt reports she was at her health baseline until this morning. She took a dose of her mother's tramadol this morning for neck pain. Later on around 11am she took her weight loss medicine Phentermine. She developed acute onset heart palpitations associated with mild dyspnea and lightheadedness about 80min later. She also reports numbness and tingling in her hands and generalized weakness. In our ER, she was found to be in atrial fibrillation with RVR. Unfortunately, she still remains A Fib with RVR after DCCV x 2 at ER. When I see her, she feels slightly better and her VR is between 120-130bpm. Reports Stable weights. Denies fever, chills, syncope, cough, chest discomfort, nausea, vomiting, bendopnea, orthopnea, upper abdominal pain, heartburn unrelated to meals, abdominal fullness, dysuria, diarrhea, pedal edema or any bleeding events.  She says she stopped taking Phentermine since this January. She drinks daily (2 glasses of wine) and denies OSA.  Denies family history of A Fib or CAD. Reports her mother has a pacemaker.  Heart Pathway Score:       Past Medical History: Past Medical History:  Diagnosis Date  . Allergy    macrobid  . Anxiety   . Arthritis    hands  . GERD (gastroesophageal reflux disease)   . Hearing loss    slight, no hearing aids  . Migraine    otc med prn  . Pneumonia    hx - resolved  . SVD (spontaneous vaginal delivery)    x 1  . Urinary  tract infection    Hx - resolved    Past Surgical History: Past Surgical History:  Procedure Laterality Date  . COLONOSCOPY  09/2003   Normal - [facility]  . endometrial ablation    . TUBAL LIGATION    . WRIST SURGERY Right      Home Medications:  Prior to Admission medications   Medication Sig Start Date End Date Taking? Authorizing Provider  busPIRone (BUSPAR) 15 MG tablet Take 15 mg by mouth 3 (three) times daily.    [provider]  docusate sodium (COLACE) 100 MG capsule Take 100 mg by mouth as needed. Reported on 01/28/2016    [provider]  HYDROcodone-acetaminophen (NORCO/VICODIN) 5-325 MG tablet Take by mouth. 01/17/16   [provider]    Inpatient Medications: Scheduled Meds:  Continuous Infusions: . diltiazem (CARDIZEM) infusion 5 mg/hr (09/05/20 0016)   PRN Meds:   Allergies:    Allergies  Allergen Reactions  . Macrobid [Nitrofurantoin] Other (See Comments)    Flu like symptoms    Social History:   Social History   Socioeconomic History  . Marital status: Married    Spouse name: Not on file  . Number of children: Not on file  . Years of education: Not on file  . Highest education level: Not on file  Occupational History  . Not on file  Tobacco Use  . Smoking status: Former Smoker    Packs/day: 0.20    Years: 15.00  Pack years: 3.00    Types: Cigarettes    Quit date: 07/18/1996    Years since quitting: 24.1  . Smokeless tobacco: Never Used  Substance and Sexual Activity  . Alcohol use: Yes    Alcohol/week: 21.0 standard drinks    Types: 21 Glasses of wine per week    Comment: 3 glasses of wine daily  . Drug use: Yes    Comment: Phentermine, Tramadol  . Sexual activity: Yes    Birth control/protection: Surgical  Other Topics Concern  . Not on file  Social History Narrative  . Not on file   Social Determinants of Health   Financial Resource Strain: Not on file  Food Insecurity: Not on file  Transportation Needs:  Not on file  Physical Activity: Not on file  Stress: Not on file  Social Connections: Not on file  Intimate Partner Violence: Not on file     Family History:    Family History  Problem Relation Age of Onset  . Hyperlipidemia Father   . Asthma Father   . Other Mother   . Heart disease Mother   . Stomach cancer Paternal Grandfather   . Colon cancer Neg Hx   . Esophageal cancer Neg Hx   . Rectal cancer Neg Hx   . Breast cancer Neg Hx      ROS:  All other ROS reviewed and negative. Pertinent positives noted in the HPI.     Physical Exam/Data:   Vitals:   09/04/20 2354 09/04/20 2357 09/05/20 0000 09/05/20 0003  BP: (!) 169/114 (!) 154/72 (!) 147/98 (!) 140/111  Pulse: (!) 147 (!) 130 (!) 134 (!) 107  Resp: 17 19 16 13   Temp:      TempSrc:      SpO2: 92% 100% 100% 100%   No intake or output data in the 24 hours ending 09/05/20 0029  Last 3 Weights 02/10/2016 01/28/2016 02/28/2014  Weight (lbs) 160 lb 160 lb 12.8 oz 142 lb  Weight (kg) 72.576 kg 72.938 kg 64.411 kg    There is no height or weight on file to calculate BMI.  General: Well nourished, well developed, in no acute distress Head: Atraumatic, normal size  Eyes: PEERLA, EOMI  Neck: Supple, no JVD Endocrine: No thryomegaly Cardiac: irregularly irregular rhythm, tachycardia, Normal S1, S2; RRR; no murmurs, rubs, or gallops Lungs: Clear to auscultation bilaterally, no wheezing, rhonchi or rales  Abd: Soft, nontender, no hepatomegaly  Ext: No edema, pulses 2+ Musculoskeletal: No deformities, BUE and BLE strength normal and equal Skin: Warm and dry, no rashes   Neuro: Alert and oriented to person, place, time, and situation, CNII-XII grossly intact, no focal deficits  Psych: Normal mood and affect   EKG:  The EKG was personally reviewed and demonstrates Afib with RVR Telemetry:  Telemetry was personally reviewed and demonstrates: Afib with RVR Relevant CV Studies: na  Laboratory Data: High Sensitivity Troponin:    Recent Labs  Lab 09/04/20 1750 09/04/20 2000  TROPONINIHS 3 4     Cardiac EnzymesNo results for input(s): TROPONINI in the last 168 hours. No results for input(s): TROPIPOC in the last 168 hours.  Chemistry Recent Labs  Lab 09/04/20 1750  NA 139  K 3.3*  CL 105  CO2 23  GLUCOSE 105*  BUN 13  CREATININE 0.84  CALCIUM 9.5  GFRNONAA >60  ANIONGAP 11    No results for input(s): PROT, ALBUMIN, AST, ALT, ALKPHOS, BILITOT in the last 168 hours. Hematology Recent Labs  Lab 09/04/20 1750  WBC 6.1  RBC 4.16  HGB 13.2  HCT 38.4  MCV 92.3  MCH 31.7  MCHC 34.4  RDW 13.1  PLT 269   BNPNo results for input(s): BNP, PROBNP in the last 168 hours.  DDimer No results for input(s): DDIMER in the last 168 hours.  Radiology/Studies:  DG Chest 2 View  Result Date: 09/04/2020 CLINICAL DATA:  Shortness of breath.  Atrial fibrillation. EXAM: CHEST - 2 VIEW COMPARISON:  08/01/2018 FINDINGS: Heart size is normal. Mediastinal shadows are normal. The lungs are clear. No bronchial thickening. No infiltrate, mass, effusion or collapse. Pulmonary vascularity is normal. No bony abnormality. IMPRESSION: Normal chest. Electronically Signed   By: Nelson Chimes M.D.   On: 09/04/2020 18:11    Assessment and Plan:  DELORES EDELSTEIN is a 55 y.o. female with a hx of GERD and migraine who is being seen today for the evaluation of atrial fibrillation at the request of ER. Patient developed new onset symptomatic A Fib with RVR (approximately 12pm today) in the setting of taking phentermine and tramadol. Tropoinin is negative. EKG did not show ischemic changes.  # Atrial fibrillation with RVR, new onset, symptomatic -s/p DCCV x 2 at ER -continue Telemetry -continue to optimize electrolytes with K (4-5) and Mg>2 -CHA2DS2-VASC score 1(sex). I have a discussion with the patient and explained the benefits and risks of start anticoagulation, she voices understanding and wants to start anticoagulation. Recommend  heparin gtt for now -Please start IV amiodarone load: IV 150 mg over 10 minutes, then 1 mg/minute for 6 hours, then 0.5 mg/minute for 18 hours. If patient stays in atrial fibrillation, we may consider DCCV again after amiodarone loading -diltiazem gtt, titrate to HR < 100, monitor for hypoT -Risk stratify with TSH, HgBA1C, lipid profile -keep pt NPO after 1am  For questions or updates, please contact [facility] Please consult [email or web address] for contact info under   Time Spent with Patient: I have spent a total of 50 minutes with patient reviewing hospital notes, telemetry, EKGs, labs and examining the patient as well as establishing an assessment and plan that was discussed with the patient.  > 50% of time was spent in direct patient care.

## 2020-09-23 ENCOUNTER — Ambulatory Visit: Payer: BLUE CROSS/BLUE SHIELD | Admitting: Cardiology

## 2020-09-25 ENCOUNTER — Other Ambulatory Visit: Payer: Self-pay

## 2020-09-25 ENCOUNTER — Ambulatory Visit (HOSPITAL_COMMUNITY): Payer: BLUE CROSS/BLUE SHIELD | Attending: Internal Medicine

## 2020-09-25 DIAGNOSIS — I480 Paroxysmal atrial fibrillation: Secondary | ICD-10-CM

## 2020-09-25 LAB — ECHOCARDIOGRAM COMPLETE
Area-P 1/2: 4.06 cm2
S' Lateral: 2.8 cm

## 2020-09-29 ENCOUNTER — Ambulatory Visit: Payer: BLUE CROSS/BLUE SHIELD | Admitting: Cardiology

## 2020-09-29 ENCOUNTER — Encounter: Payer: Self-pay | Admitting: Cardiology

## 2020-09-29 ENCOUNTER — Ambulatory Visit: Payer: BLUE CROSS/BLUE SHIELD

## 2020-09-29 ENCOUNTER — Other Ambulatory Visit: Payer: Self-pay

## 2020-09-29 ENCOUNTER — Encounter: Payer: Self-pay | Admitting: *Deleted

## 2020-09-29 VITALS — BP 134/82 | HR 74 | Ht 68.0 in | Wt 155.6 lb

## 2020-09-29 DIAGNOSIS — I480 Paroxysmal atrial fibrillation: Secondary | ICD-10-CM

## 2020-09-29 NOTE — Progress Notes (Signed)
Cardiology Office Note:    Date:  10/01/2020   ID:  Kim Irwin, DOB 08/08/1965, MRN [id number]  PCP:  Everardo Beals, NP  Cardiologist:  No primary care provider on file.  Electrophysiologist:  None   Referring MD: Everardo Beals, NP   Chief Complaint  Patient presents with  . Atrial Fibrillation    History of Present Illness:    Kim Irwin is a 55 y.o. female with a hx of paroxysmal atrial fibrillation, GERD who presents for hospital follow-up.  She was admitted to [facility] on 09/05/2020.  She presented with new onset A. fib with RVR after taking phentermine and tramadol.  She was found to be COVID-19 positive.  She failed DCCV x2 in the ED and was started on IV amiodarone and diltiazem.  She converted to normal sinus rhythm.  CHA2DS2-VASc score 1, she declined anticoagulation.  Since discharge from the hospital, she reports that she has been doing well.  She has not felt like she has had any further atrial fibrillation.  She had been taking phentermine for weight loss, but has not taken any since her hospitalization.  Denies any chest pain, dyspnea, lightheadedness, syncope, lower extremity edema, or palpitations.  She took diltiazem for about 1.5 weeks and then stopped.  She has been walking 2 times per week for 20 to 30 minutes.  She denies any exertional symptoms.   Past Medical History:  Diagnosis Date  . Allergy    macrobid  . Anxiety   . Arthritis    hands  . GERD (gastroesophageal reflux disease)   . Hearing loss    slight, no hearing aids  . Migraine    otc med prn  . Pneumonia    hx - resolved  . SVD (spontaneous vaginal delivery)    x 1  . Urinary tract infection    Hx - resolved    Past Surgical History:  Procedure Laterality Date  . COLONOSCOPY  09/2003   Normal - [place]  . endometrial ablation    . TUBAL LIGATION    . WRIST SURGERY Right     Current Medications: No outpatient medications have been marked as taking for the 09/29/20  encounter (Office Visit) with Donato Heinz, MD.     Allergies:   Macrobid [nitrofurantoin]   Social History   Socioeconomic History  . Marital status: Married    Spouse name: Not on file  . Number of children: Not on file  . Years of education: Not on file  . Highest education level: Not on file  Occupational History  . Not on file  Tobacco Use  . Smoking status: Former Smoker    Packs/day: 0.20    Years: 15.00    Pack years: 3.00    Types: Cigarettes    Quit date: 07/18/1996    Years since quitting: 24.2  . Smokeless tobacco: Never Used  Substance and Sexual Activity  . Alcohol use: Yes    Alcohol/week: 21.0 standard drinks    Types: 21 Glasses of wine per week    Comment: 3 glasses of wine daily  . Drug use: Yes    Comment: Phentermine, Tramadol  . Sexual activity: Yes    Birth control/protection: Surgical  Other Topics Concern  . Not on file  Social History Narrative  . Not on file   Social Determinants of Health   Financial Resource Strain: Not on file  Food Insecurity: Not on file  Transportation Needs: Not on file  Physical  Activity: Not on file  Stress: Not on file  Social Connections: Not on file     Family History: The patient's family history includes Asthma in her father; Heart disease in her mother; Hyperlipidemia in her father; Other in her mother; Stomach cancer in her paternal grandfather. There is no history of Colon cancer, Esophageal cancer, Rectal cancer, or Breast cancer.  ROS:   Please see the history of present illness.     All other systems reviewed and are negative.  EKGs/Labs/Other Studies Reviewed:    The following studies were reviewed today:   EKG:  EKG is  ordered today.  The ekg ordered today demonstrates normal sinus rhythm, rate 74, no ST/T abnormalities  Recent Labs: 09/04/2020: Hemoglobin 13.2; Platelets 269 09/05/2020: BUN 10; Creatinine, Ser 0.86; Magnesium 1.7; Potassium 3.9; Sodium 138; TSH 5.485  Recent  Lipid Panel    Component Value Date/Time   CHOL 155 09/14/2011 0848   TRIG 54 09/14/2011 0848   HDL 76 09/14/2011 0848   CHOLHDL 2.0 09/14/2011 0848   VLDL 11 09/14/2011 0848   LDLCALC 68 09/14/2011 0848    Physical Exam:    VS:  BP 134/82 (BP Location: Left Arm, Patient Position: Sitting)   Pulse 74   Ht 5\' 8"  (1.727 m)   Wt 155 lb 9.6 oz (70.6 kg)   SpO2 98%   BMI 23.66 kg/m     Wt Readings from Last 3 Encounters:  09/29/20 155 lb 9.6 oz (70.6 kg)  09/05/20 149 lb 4 oz (67.7 kg)  02/10/16 160 lb (72.6 kg)     GEN:  Well nourished, well developed in no acute distress HEENT: Normal NECK: No JVD; No carotid bruits LYMPHATICS: No lymphadenopathy CARDIAC: RRR, no murmurs, rubs, gallops RESPIRATORY:  Clear to auscultation without rales, wheezing or rhonchi  ABDOMEN: Soft, non-tender, non-distended MUSCULOSKELETAL:  No edema; No deformity  SKIN: Warm and dry NEUROLOGIC:  Alert and oriented x 3 PSYCHIATRIC:  Normal affect   ASSESSMENT:    1. Paroxysmal atrial fibrillation (HCC)    PLAN:    Paroxysmal atrial fibrillation: Presented with A. fib with RVR 08/2020 after taking phentermine and tramadol, in setting of COVID-19 infection.  CHA2DS2-VASc score 1 (female).  Echocardiogram on 3/11 showed normal biventricular function, no significant valvular disease (read as bicuspid AV but appears tricuspid on my read, no AI/AS) -Discussed anticoagulation optional with CHA2DS2-VASc score 1, she would prefer to hold off -Discontinued phentermine -She stopped taking diltiazem.  Will check Zio patch x2 weeks to evaluate for any further A. fib.  If no A. fib seen, suspect episode leading to hospitalization was provoked by taking phentermine and COVID-19 infection, and can hold off on taking scheduled diltiazem  RTC in 6 months   Medication Adjustments/Labs and Tests Ordered: Current medicines are reviewed at length with the patient today.  Concerns regarding medicines are outlined  above.  Orders Placed This Encounter  Procedures  . LONG TERM MONITOR (3-14 DAYS)  . EKG 12-Lead   No orders of the defined types were placed in this encounter.   Patient Instructions  Medication Instructions:  Your physician recommends that you continue on your current medications as directed. Please refer to the Current Medication list given to you today.  *If you need a refill on your cardiac medications before your next appointment, please call your pharmacy*  Testing/Procedures:  ZIO XT- Long Term Monitor Instructions   Your physician has requested you wear your ZIO patch monitor____14___days.   This is  a single patch monitor.  [employer] supplies one patch monitor per enrollment.  Additional stickers are not available.   Please do not apply patch if you will be having a Nuclear Stress Test, Echocardiogram, Cardiac CT, MRI, or Chest Xray during the time frame you would be wearing the monitor. The patch cannot be worn during these tests.  You cannot remove and re-apply the ZIO XT patch monitor.   Your ZIO patch monitor will be sent USPS Priority mail from [facility] directly to your home address. The monitor may also be mailed to a PO BOX if home delivery is not available.   It may take 3-5 days to receive your monitor after you have been enrolled.   Once you have received you monitor, please review enclosed instructions.  Your monitor has already been registered assigning a specific monitor serial # to you.   Applying the monitor   Shave hair from upper left chest.   Hold abrader disc by orange tab.  Rub abrader in 40 strokes over left upper chest as indicated in your monitor instructions.   Clean area with 4 enclosed alcohol pads .  Use all pads to assure are is cleaned thoroughly.  Let dry.   Apply patch as indicated in monitor instructions.  Patch will be place under collarbone on left side of chest with arrow pointing upward.   Rub patch adhesive wings for 2  minutes.Remove white label marked "1".  Remove white label marked "2".  Rub patch adhesive wings for 2 additional minutes.   While looking in a mirror, press and release button in center of patch.  A small green light will flash 3-4 times .  This will be your only indicator the monitor has been turned on.     Do not shower for the first 24 hours.  You may shower after the first 24 hours.   Press button if you feel a symptom. You will hear a small click.  Record Date, Time and Symptom in the Patient Log Book.   When you are ready to remove patch, follow instructions on last 2 pages of Patient Log Book.  Stick patch monitor onto last page of Patient Log Book.   Place Patient Log Book in [place] box.  Use locking tab on box and tape box closed securely.  The [employer] and [employer] has [employer] on it.  Please place in mailbox as soon as possible.  Your physician should have your test results approximately 7 days after the monitor has been mailed back to [facility].   Call [place] at [phone] if you have questions regarding your ZIO XT patch monitor.  Call them immediately if you see an orange light blinking on your monitor.   If your monitor falls off in less than 4 days contact our Monitor department at [phone].  If your monitor becomes loose or falls off after 4 days call [employer] at [phone] for suggestions on securing your monitor.   Follow-Up: At [facility], you and your health needs are our priority.  As part of our continuing mission to provide you with exceptional heart care, we have created designated Provider Care Teams.  These Care Teams include your primary Cardiologist (physician) and Advanced Practice Providers (APPs -  Physician Assistants and Nurse Practitioners) who all work together to provide you with the care you need, when you need it.  We recommend signing up for the patient portal called "MyChart".  Sign up information is provided  on  this After Visit Summary.  MyChart is used to connect with patients for Virtual Visits (Telemedicine).  Patients are able to view lab/test results, encounter notes, upcoming appointments, etc.  Non-urgent messages can be sent to your provider as well.   To learn more about what you can do with MyChart, go to [email or web address].    Your next appointment:   6 month(s)  The format for your next appointment:   In Person  Provider:   Oswaldo Milian, MD      Signed, Donato Heinz, MD  10/01/2020 4:36 PM    [place]

## 2020-09-29 NOTE — Progress Notes (Signed)
Patient ID: Kim Irwin, female   DOB: [birth year], 55 y.o.   MRN: [id number] Patient enrolled for [employer] to ship a 14 day ZIO XT long term holter monitor to her home.

## 2020-09-29 NOTE — Patient Instructions (Signed)
Medication Instructions:  Your physician recommends that you continue on your current medications as directed. Please refer to the Current Medication list given to you today.  *If you need a refill on your cardiac medications before your next appointment, please call your pharmacy*  Testing/Procedures:  ZIO XT- Long Term Monitor Instructions   Your physician has requested you wear your ZIO patch monitor____14___days.   This is a single patch monitor.  [employer] supplies one patch monitor per enrollment.  Additional stickers are not available.   Please do not apply patch if you will be having a Nuclear Stress Test, Echocardiogram, Cardiac CT, MRI, or Chest Xray during the time frame you would be wearing the monitor. The patch cannot be worn during these tests.  You cannot remove and re-apply the ZIO XT patch monitor.   Your ZIO patch monitor will be sent USPS Priority mail from [facility] directly to your home address. The monitor may also be mailed to a PO BOX if home delivery is not available.   It may take 3-5 days to receive your monitor after you have been enrolled.   Once you have received you monitor, please review enclosed instructions.  Your monitor has already been registered assigning a specific monitor serial # to you.   Applying the monitor   Shave hair from upper left chest.   Hold abrader disc by orange tab.  Rub abrader in 40 strokes over left upper chest as indicated in your monitor instructions.   Clean area with 4 enclosed alcohol pads .  Use all pads to assure are is cleaned thoroughly.  Let dry.   Apply patch as indicated in monitor instructions.  Patch will be place under collarbone on left side of chest with arrow pointing upward.   Rub patch adhesive wings for 2 minutes.Remove white label marked "1".  Remove white label marked "2".  Rub patch adhesive wings for 2 additional minutes.   While looking in a mirror, press and release button in center of patch.   A small green light will flash 3-4 times .  This will be your only indicator the monitor has been turned on.     Do not shower for the first 24 hours.  You may shower after the first 24 hours.   Press button if you feel a symptom. You will hear a small click.  Record Date, Time and Symptom in the Patient Log Book.   When you are ready to remove patch, follow instructions on last 2 pages of Patient Log Book.  Stick patch monitor onto last page of Patient Log Book.   Place Patient Log Book in [place] box.  Use locking tab on box and tape box closed securely.  The [employer] has [employer] on it.  Please place in mailbox as soon as possible.  Your physician should have your test results approximately 7 days after the monitor has been mailed back to [facility].   Call [place] at [phone] if you have questions regarding your ZIO XT patch monitor.  Call them immediately if you see an orange light blinking on your monitor.   If your monitor falls off in less than 4 days contact our Monitor department at [phone].  If your monitor becomes loose or falls off after 4 days call [employer] at [phone] for suggestions on securing your monitor.   Follow-Up: At [facility], you and your health needs are our priority.  As part of our continuing  mission to provide you with exceptional heart care, we have created designated Provider Care Teams.  These Care Teams include your primary Cardiologist (physician) and Advanced Practice Providers (APPs -  Physician Assistants and Nurse Practitioners) who all work together to provide you with the care you need, when you need it.  We recommend signing up for the patient portal called "MyChart".  Sign up information is provided on this After Visit Summary.  MyChart is used to connect with patients for Virtual Visits (Telemedicine).  Patients are able to view lab/test results, encounter notes, upcoming appointments, etc.   Non-urgent messages can be sent to your provider as well.   To learn more about what you can do with MyChart, go to [email or web address].    Your next appointment:   6 month(s)  The format for your next appointment:   In Person  Provider:   Oswaldo Milian, MD

## 2020-11-25 DIAGNOSIS — N390 Urinary tract infection, site not specified: Secondary | ICD-10-CM | POA: Diagnosis not present

## 2021-01-21 ENCOUNTER — Other Ambulatory Visit: Payer: Self-pay | Admitting: Obstetrics and Gynecology

## 2021-01-21 DIAGNOSIS — Z1231 Encounter for screening mammogram for malignant neoplasm of breast: Secondary | ICD-10-CM

## 2021-01-22 DIAGNOSIS — R42 Dizziness and giddiness: Secondary | ICD-10-CM | POA: Diagnosis not present

## 2021-01-22 DIAGNOSIS — R110 Nausea: Secondary | ICD-10-CM | POA: Diagnosis not present

## 2021-01-22 DIAGNOSIS — I482 Chronic atrial fibrillation, unspecified: Secondary | ICD-10-CM | POA: Diagnosis not present

## 2021-03-16 ENCOUNTER — Other Ambulatory Visit: Payer: Self-pay

## 2021-03-16 ENCOUNTER — Ambulatory Visit
Admission: RE | Admit: 2021-03-16 | Discharge: 2021-03-16 | Disposition: A | Discharge status: Home / Self Care | Payer: BLUE CROSS/BLUE SHIELD | Source: Ambulatory Visit | Attending: Obstetrics and Gynecology | Admitting: Obstetrics and Gynecology

## 2021-03-16 DIAGNOSIS — Z1231 Encounter for screening mammogram for malignant neoplasm of breast: Secondary | ICD-10-CM

## 2021-04-08 DIAGNOSIS — D2361 Other benign neoplasm of skin of right upper limb, including shoulder: Secondary | ICD-10-CM | POA: Diagnosis not present

## 2021-04-08 DIAGNOSIS — D225 Melanocytic nevi of trunk: Secondary | ICD-10-CM | POA: Diagnosis not present

## 2021-04-08 DIAGNOSIS — L821 Other seborrheic keratosis: Secondary | ICD-10-CM | POA: Diagnosis not present

## 2021-04-08 DIAGNOSIS — L814 Other melanin hyperpigmentation: Secondary | ICD-10-CM | POA: Diagnosis not present

## 2021-04-08 DIAGNOSIS — D485 Neoplasm of uncertain behavior of skin: Secondary | ICD-10-CM | POA: Diagnosis not present

## 2021-04-08 DIAGNOSIS — D3617 Benign neoplasm of peripheral nerves and autonomic nervous system of trunk, unspecified: Secondary | ICD-10-CM | POA: Diagnosis not present

## 2021-04-08 DIAGNOSIS — L570 Actinic keratosis: Secondary | ICD-10-CM | POA: Diagnosis not present

## 2021-04-14 ENCOUNTER — Ambulatory Visit: Payer: BLUE CROSS/BLUE SHIELD | Admitting: Cardiology

## 2021-06-20 ENCOUNTER — Emergency Department (HOSPITAL_COMMUNITY): Payer: BC Managed Care – PPO

## 2021-06-20 ENCOUNTER — Observation Stay (HOSPITAL_COMMUNITY)
Admission: EM | Admit: 2021-06-20 | Discharge: 2021-06-21 | Disposition: A | Discharge status: Home / Self Care | Payer: BC Managed Care – PPO | Attending: Family Medicine | Admitting: Family Medicine

## 2021-06-20 ENCOUNTER — Encounter (HOSPITAL_COMMUNITY): Payer: Self-pay

## 2021-06-20 ENCOUNTER — Other Ambulatory Visit: Payer: Self-pay

## 2021-06-20 ENCOUNTER — Inpatient Hospital Stay (HOSPITAL_COMMUNITY): Payer: BC Managed Care – PPO

## 2021-06-20 DIAGNOSIS — G459 Transient cerebral ischemic attack, unspecified: Secondary | ICD-10-CM | POA: Diagnosis not present

## 2021-06-20 DIAGNOSIS — I4891 Unspecified atrial fibrillation: Secondary | ICD-10-CM | POA: Diagnosis not present

## 2021-06-20 DIAGNOSIS — R29818 Other symptoms and signs involving the nervous system: Secondary | ICD-10-CM | POA: Diagnosis not present

## 2021-06-20 DIAGNOSIS — I639 Cerebral infarction, unspecified: Secondary | ICD-10-CM | POA: Diagnosis not present

## 2021-06-20 DIAGNOSIS — Z87891 Personal history of nicotine dependence: Secondary | ICD-10-CM | POA: Insufficient documentation

## 2021-06-20 DIAGNOSIS — Z20822 Contact with and (suspected) exposure to covid-19: Secondary | ICD-10-CM | POA: Insufficient documentation

## 2021-06-20 DIAGNOSIS — E876 Hypokalemia: Secondary | ICD-10-CM | POA: Diagnosis not present

## 2021-06-20 DIAGNOSIS — R531 Weakness: Secondary | ICD-10-CM

## 2021-06-20 DIAGNOSIS — I480 Paroxysmal atrial fibrillation: Secondary | ICD-10-CM | POA: Diagnosis present

## 2021-06-20 HISTORY — DX: Paroxysmal atrial fibrillation: I48.0

## 2021-06-20 LAB — CBC
HCT: 44.5 % (ref 36.0–46.0)
Hemoglobin: 14.8 g/dL (ref 12.0–15.0)
MCH: 31.8 pg (ref 26.0–34.0)
MCHC: 33.3 g/dL (ref 30.0–36.0)
MCV: 95.5 fL (ref 80.0–100.0)
Platelets: 276 10*3/uL (ref 150–400)
RBC: 4.66 MIL/uL (ref 3.87–5.11)
RDW: 12.7 % (ref 11.5–15.5)
WBC: 8.6 10*3/uL (ref 4.0–10.5)
nRBC: 0 % (ref 0.0–0.2)

## 2021-06-20 LAB — PROTIME-INR
INR: 1 (ref 0.8–1.2)
Prothrombin Time: 13 seconds (ref 11.4–15.2)

## 2021-06-20 LAB — I-STAT CHEM 8, ED
BUN: 18 mg/dL (ref 6–20)
Calcium, Ion: 1.17 mmol/L (ref 1.15–1.40)
Chloride: 107 mmol/L (ref 98–111)
Creatinine, Ser: 0.8 mg/dL (ref 0.44–1.00)
Glucose, Bld: 86 mg/dL (ref 70–99)
HCT: 44 % (ref 36.0–46.0)
Hemoglobin: 15 g/dL (ref 12.0–15.0)
Potassium: 4 mmol/L (ref 3.5–5.1)
Sodium: 141 mmol/L (ref 135–145)
TCO2: 24 mmol/L (ref 22–32)

## 2021-06-20 LAB — RESP PANEL BY RT-PCR (FLU A&B, COVID) ARPGX2
Influenza A by PCR: NEGATIVE
Influenza B by PCR: NEGATIVE
SARS Coronavirus 2 by RT PCR: NEGATIVE

## 2021-06-20 LAB — COMPREHENSIVE METABOLIC PANEL
ALT: 21 U/L (ref 0–44)
AST: 29 U/L (ref 15–41)
Albumin: 4.2 g/dL (ref 3.5–5.0)
Alkaline Phosphatase: 47 U/L (ref 38–126)
Anion gap: 12 (ref 5–15)
BUN: 16 mg/dL (ref 6–20)
CO2: 22 mmol/L (ref 22–32)
Calcium: 9.9 mg/dL (ref 8.9–10.3)
Chloride: 105 mmol/L (ref 98–111)
Creatinine, Ser: 0.81 mg/dL (ref 0.44–1.00)
GFR, Estimated: >60 mL/min (ref >60)
Glucose, Bld: 90 mg/dL (ref 70–99)
Potassium: 3.7 mmol/L (ref 3.5–5.1)
Sodium: 139 mmol/L (ref 135–145)
Total Bilirubin: 0.7 mg/dL (ref 0.3–1.2)
Total Protein: 7.6 g/dL (ref 6.5–8.1)

## 2021-06-20 LAB — DIFFERENTIAL
Abs Immature Granulocytes: 0.03 10*3/uL (ref 0.00–0.07)
Basophils Absolute: 0.1 10*3/uL (ref 0.0–0.1)
Basophils Relative: 1 %
Eosinophils Absolute: 0.2 10*3/uL (ref 0.0–0.5)
Eosinophils Relative: 2 %
Immature Granulocytes: 0 %
Lymphocytes Relative: 38 %
Lymphs Abs: 3.3 10*3/uL (ref 0.7–4.0)
Monocytes Absolute: 0.9 10*3/uL (ref 0.1–1.0)
Monocytes Relative: 11 %
Neutro Abs: 4.1 10*3/uL (ref 1.7–7.7)
Neutrophils Relative %: 48 %

## 2021-06-20 LAB — I-STAT BETA HCG BLOOD, ED (MC, WL, AP ONLY): I-stat hCG, quantitative: <5 m[IU]/mL (ref <5)

## 2021-06-20 LAB — CBG MONITORING, ED: Glucose-Capillary: 97 mg/dL (ref 70–99)

## 2021-06-20 LAB — APTT: aPTT: 27 seconds (ref 24–36)

## 2021-06-20 MED ORDER — ASPIRIN 325 MG PO TABS
325.0000 mg | ORAL_TABLET | Freq: Once | ORAL | Status: AC
  Administered 2021-06-20: 14:00:00 325 mg via ORAL
  Filled 2021-06-20: qty 1

## 2021-06-20 MED ORDER — SODIUM CHLORIDE 0.9% FLUSH
3.0000 mL | Freq: Once | INTRAVENOUS | Status: AC
Start: 2021-06-20 — End: 2021-06-20
  Administered 2021-06-20: 11:00:00 3 mL via INTRAVENOUS

## 2021-06-20 NOTE — ED Provider Notes (Signed)
[facility] EMERGENCY DEPARTMENT Provider Note   CSN: [id number] Arrival date & time: 06/20/21  1041  An emergency department physician performed an initial assessment on this suspected stroke patient at 1045.  History Chief Complaint  Patient presents with   Code Stroke     Kim Irwin is a 55 y.o. female.  HPI Level 85 caveat 55 year old female history of paroxysmal A. fib, not on anticoagulation, presents today with new onset of left-sided weakness with last known normal at 1030.  She was riding in the truck with her husband when he noticed that something was wrong.  She appeared generally weak and somewhat confused.  CBG here is 97.    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 09/05/2020   History of anxiety 09/05/2020   COVID-19 virus infection 09/05/2020   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     Family History  Problem Relation Age of Onset   Hyperlipidemia Father    Asthma Father    Other Mother    Heart disease Mother    Stomach cancer Paternal Grandfather    Colon cancer Neg Hx    Esophageal cancer Neg Hx    Rectal cancer Neg Hx    Breast cancer Neg Hx     Social History   Tobacco Use   Smoking status: Former    Packs/day: 0.20    Years: 15.00    Pack years: 3.00    Types: Cigarettes    Quit date: 07/18/1996    Years since quitting: 24.9   Smokeless tobacco: Never  Substance Use Topics   Alcohol use: Yes    Alcohol/week: 21.0 standard drinks    Types: 21 Glasses of wine per week    Comment: 3 glasses of wine daily   Drug use: Yes    Comment: Phentermine, Tramadol    Home Medications Prior to Admission medications   Medication Sig Start Date End Date Taking? Authorizing Provider  diltiazem (CARDIZEM CD) 120 MG 24 hr capsule Take 1 capsule (120 mg total) by mouth daily. Patient not taking: Reported on 09/29/2020 09/06/20 12/05/20  Barb Merino, MD    Allergies    Macrobid [nitrofurantoin]  Review of Systems   Review of Systems  All other systems reviewed and are negative.  Physical Exam Updated Vital Signs BP 134/89   Pulse 77   Temp 98.2 F (36.8 C) (Oral)   Resp 19   SpO2 100%   Physical Exam Constitutional:      Appearance: Normal appearance.  HENT:     Head: Normocephalic.     Right Ear: External ear normal.     Left Ear: External ear normal.     Nose: Nose normal.     Mouth/Throat:     Pharynx: Oropharynx is clear.  Eyes:     Extraocular Movements: Extraocular movements intact.     Pupils: Pupils are equal, round, and reactive to light.  Cardiovascular:     Pulses: Normal pulses.  Pulmonary:     Effort: Pulmonary effort is normal.  Abdominal:     General: Abdomen is flat.  Musculoskeletal:        General: Normal range of motion.     Cervical back: Normal range of motion.  Skin:     General: Skin is warm.     Capillary Refill: Capillary refill takes less than 2 seconds.  Neurological:     Mental Status: She is alert. She is disoriented.     Cranial Nerves: Cranial nerve deficit present.     Sensory: No sensory deficit.     Motor: No weakness.     Coordination: Coordination normal.     Deep Tendon Reflexes: Reflexes normal.     Comments: Facial asymmetry with apparent left-sided facial droop  Psychiatric:        Mood and Affect: Mood normal.        Behavior: Behavior normal.    ED Results / Procedures / Treatments   Labs (all labs ordered are listed, but only abnormal results are displayed) Labs Reviewed  RESP PANEL BY RT-PCR (FLU A&B, COVID) ARPGX2  PROTIME-INR  APTT  CBC  DIFFERENTIAL  COMPREHENSIVE METABOLIC PANEL  I-STAT CHEM 8, ED  CBG MONITORING, ED  I-STAT BETA HCG BLOOD, ED ([facility], [facility], [facility] ONLY)    EKG EKG Interpretation  Date/Time:  Sunday June 20 2021 11:21:31 EST Ventricular Rate:  106 PR Interval:    QRS Duration: 89 QT Interval:  343 QTC Calculation: 456 R Axis:   62 Text Interpretation: Atrial fibrillation Confirmed by Pattricia Boss [phone]) on 06/20/2021 11:32:20 AM  Radiology CT HEAD CODE STROKE WO CONTRAST  Result Date: 06/20/2021 CLINICAL DATA:  Code stroke.  55 year old female. EXAM: CT HEAD WITHOUT CONTRAST TECHNIQUE: Contiguous axial images were obtained from the base of the skull through the vertex without intravenous contrast. COMPARISON:  None. FINDINGS: Brain: Cerebral volume is within normal limits for age. No midline shift, ventriculomegaly, mass effect, evidence of mass lesion, intracranial hemorrhage or evidence of cortically based acute infarction. Gray-white matter differentiation is within normal limits throughout the brain. No encephalomalacia identified. Vascular: No suspicious intracranial vascular hyperdensity. Skull: Negative. Sinuses/Orbits: Visualized paranasal sinuses and mastoids are clear. Other: Visualized  orbit soft tissues are within normal limits. Visualized scalp soft tissues are within normal limits. ASPECTS [facility] Stroke Program Early CT Score) Total score (0-10 with 10 being normal): 10 IMPRESSION: 1. Normal for age non contrast CT appearance of the brain. ASPECTS 10. 2. These results were communicated to Dr. Cheral Marker at 11:05 am on 06/20/2021 by text page via the [facility] messaging system. Electronically Signed   By: Genevie Ann M.D.   On: 06/20/2021 11:05  Procedures .Critical Care Performed by: Pattricia Boss, MD Authorized by: Pattricia Boss, MD   Critical care provider statement:    Critical care time (minutes):  45   Critical care was necessary to treat or prevent imminent or life-threatening deterioration of the following conditions:  CNS failure or compromise   Critical care was time spent personally by me on the following activities:  Development of treatment plan with patient or surrogate, discussions with consultants, evaluation of patient's response to treatment, examination of patient, ordering and review of laboratory studies, ordering and review of radiographic studies, ordering and performing treatments and interventions, pulse oximetry, re-evaluation of patient's condition and review of old charts   Medications Ordered in ED Medications  sodium chloride flush (NS) 0.9 % injection 3 mL (3 mLs Intravenous Given 06/20/21 1129)    ED Course  I have reviewed the triage vital signs and the nursing notes.  Pertinent labs & imaging results that were available during my care of the patient were reviewed by me and considered in my medical decision making (see chart for details).    MDM Rules/Calculators/A&P                         55 year old female history of paroxysmal atrial fibrillation, not on anticoagulation presents today with a sudden onset of weakness, mild lip tingling, and some mild confusion.  On my initial evaluation she had facial asymmetry. Was made a code stroke on her  initial presentation.  She was seen in coordination with Dr. Cheral Marker.  CT had no evidence of acute abnormalities.  Her symptoms have essentially resolved upon Dr. Yvetta Coder evaluation and she had a normal neurological exam. Today showed she is in atrial fibrillation.  She will need further evaluation to identify stroke and then likely anticoagulation. Discussed with family practice team as patient is unassigned.  They will see for admission. Final Clinical Impression(s) / ED Diagnoses Final diagnoses:  Cerebrovascular accident (CVA), unspecified mechanism ([place])  Paroxysmal atrial fibrillation [facility])    Rx / DC Orders ED Discharge Orders     None        Pattricia Boss, MD 06/20/21 1145

## 2021-06-20 NOTE — Consult Note (Signed)
NEURO HOSPITALIST CONSULT NOTE   Requestig physician: Dr. Thompson Grayer  Reason for Consult:Acute onset of right > left perioral paresthesias and dizziness  History obtained from:  Patient and Chart     HPI:                                                                                                                                          Kim Irwin is an 55 y.o. female with atrial fibrillation diagnosed in February, who presents with acute onset of right > left perioral paresthesias and dizziness. Her CHA2DS2VASC score was 1 when she was diagnosed with atrial fibrillation in February. Patient given the option at that time to start anticoagulation and based on risk/benefit discussion she elected not to start.   Today, she arrived to ED via POV with a chief complaint of stroke-like symptoms. She reports dizziness and numbness to the right side of her face from the nose down. Also with slight, less noticeable perioral tingling on the left. IHK [id number] while riding in the truck with her husband. Code Stroke was activated in triage  Had one prior episode of the above symptoms while in [place] this July. At that time she was evaluated in a hospital setting and per patient's recollection, her EKG showed atrial fibrillation. She had a PCP follow up that same month at which she states EKG showed NSR.   Past Medical History:  Diagnosis Date   Allergy    macrobid   Anxiety    Arthritis    hands   GERD (gastroesophageal reflux disease)    Hearing loss    slight, no hearing aids   Migraine    otc med prn   Paroxysmal A-fib (HCC)    Pneumonia    hx - resolved   SVD (spontaneous vaginal delivery)    x 1   Urinary tract infection    Hx - resolved    Past Surgical History:  Procedure Laterality Date   COLONOSCOPY  09/2003   Normal - [facility]   endometrial ablation     TUBAL LIGATION     WRIST SURGERY Right     Family History  Problem Relation Age of Onset    Hyperlipidemia Father    Asthma Father    Other Mother    Heart disease Mother    Stomach cancer Paternal Grandfather    Colon cancer Neg Hx    Esophageal cancer Neg Hx    Rectal cancer Neg Hx    Breast cancer Neg Hx              Social History:  reports that she quit smoking about 24 years ago. Her smoking use included cigarettes. She has a 3.00 pack-year smoking history. She has never used smokeless tobacco.  She reports current alcohol use of about 21.0 standard drinks per week. She reports current drug use.  Allergies  Allergen Reactions   Macrobid [Nitrofurantoin] Other (See Comments)    Flu like symptoms    HOME MEDICATIONS:                                                                                                                      No current facility-administered medications on file prior to encounter.   Current Outpatient Medications on File Prior to Encounter  Medication Sig Dispense Refill   Coenzyme Q10 (COQ10) 200 MG CAPS Take 1 capsule by mouth daily.     loratadine (CLARITIN) 10 MG tablet Take 10 mg by mouth daily.     Magnesium 200 MG TABS Take 1 tablet by mouth daily.     omega-3 acid ethyl esters (LOVAZA) 1 g capsule Take 1 g by mouth 2 (two) times daily.     omeprazole (PRILOSEC) 20 MG capsule Take 20 mg by mouth daily.     pyridOXINE (VITAMIN B-6) 100 MG tablet Take 100 mg by mouth daily.     Tetrahydrozoline HCl (VISINE OP) Place 1 drop into both eyes daily as needed (dry eyes).     thiamine (VITAMIN B-1) 100 MG tablet Take 100 mg by mouth daily.     Turmeric (QC TUMERIC COMPLEX PO) Take 1 tablet by mouth daily.     diltiazem (CARDIZEM CD) 120 MG 24 hr capsule Take 1 capsule (120 mg total) by mouth daily. (Patient not taking: Reported on 09/29/2020) 30 capsule 2     ROS:                                                                                                                                       As per HPI.    Blood pressure 126/89, pulse (!)  102, temperature 98.2 F (36.8 C), temperature source Oral, resp. rate 13, height 5\' 8"  (1.727 m), weight 72.6 kg, SpO2 96 %.   General Examination:  Physical Exam  HEENT-  [place]/AT   Lungs- Respirations unlabored Extremities- No edema  Neurological Examination Mental Status: Awake and alert. Fully oriented x 5. Speech is hypophonic but fluent with intact naming and comprehension. Repetition intact. Pauses prior to give some answers and appears anxious. No dysarthria.  Cranial Nerves: II: Temporal visual intact with no extinction to DSS. PERRL.   III,IV, VI: EOMI. No nystagmus. No ptosis.  V: Temp sensation intact, including to perioral region bilatearally.  VII: Smile symmetric. Some quivering/pursing of lip on the left with initial smile which appeared functional, then resolved after about 3 seconds.  VIII: Hearing intact to voice IX,X: No hoarseness. Mildly hypophonic speech XI: Symmetric shoulder shrug XII: Midline tongue extension Motor: RUE 5/5 LUE 5/5 No pronator drift. Orbiting fingers test is symmetric.  RLE 5/5 LLE 5/5 Sensory: Temp and light touch intact throughout, bilaterally. No extinction to DSS.  Deep Tendon Reflexes: 2+ and symmetric throughout Plantars: Right: downgoing   Left: downgoing Cerebellar: No ataxia with FNF bilaterally  Gait: Deferred   Lab Results: Basic Metabolic Panel: Recent Labs  Lab 06/20/21 1047 06/20/21 1054  NA 139 141  K 3.7 4.0  CL 105 107  CO2 22  --   GLUCOSE 90 86  BUN 16 18  CREATININE 0.81 0.80  CALCIUM 9.9  --     CBC: Recent Labs  Lab 06/20/21 1047 06/20/21 1054  WBC 8.6  --   NEUTROABS 4.1  --   HGB 14.8 15.0  HCT 44.5 44.0  MCV 95.5  --   PLT 276  --     Cardiac Enzymes: No results for input(s): CKTOTAL, CKMB, CKMBINDEX, TROPONINI in the last 168 hours.  Lipid Panel: No results for input(s): CHOL, TRIG,  HDL, CHOLHDL, VLDL, LDLCALC in the last 168 hours.  Imaging: CT HEAD CODE STROKE WO CONTRAST  Result Date: 06/20/2021 CLINICAL DATA:  Code stroke.  55 year old female. EXAM: CT HEAD WITHOUT CONTRAST TECHNIQUE: Contiguous axial images were obtained from the base of the skull through the vertex without intravenous contrast. COMPARISON:  None. FINDINGS: Brain: Cerebral volume is within normal limits for age. No midline shift, ventriculomegaly, mass effect, evidence of mass lesion, intracranial hemorrhage or evidence of cortically based acute infarction. Gray-white matter differentiation is within normal limits throughout the brain. No encephalomalacia identified. Vascular: No suspicious intracranial vascular hyperdensity. Skull: Negative. Sinuses/Orbits: Visualized paranasal sinuses and mastoids are clear. Other: Visualized orbit soft tissues are within normal limits. Visualized scalp soft tissues are within normal limits. ASPECTS [facility] Stroke Program Early CT Score) Total score (0-10 with 10 being normal): 10 IMPRESSION: 1. Normal for age non contrast CT appearance of the brain. ASPECTS 10. 2. These results were communicated to Dr. Cheral Marker at 11:05 am on 06/20/2021 by text page via the [facility] messaging system. Electronically Signed   By: Genevie Ann M.D.   On: 06/20/2021 11:05     Assessment: 55 year old female presenting with acute onset of right perioral tingling and dizziness 1. Exam is nonfocal. Still with R > L perioral/lip paresthesia, but no sensory loss in the same distribution.  2. Normal for age non contrast CT appearance of the brain. ASPECTS 10. 3. DDx includes TIA, but this is lest likely given that her perioral paresthesias are bilateral. Her symptoms may also be anxiety-related.  4. MRI brain: No acute infarction, hemorrhage, or mass.  Recommendations: 1. Recommend starting the patient on Eliquis for her paroxysmal versus permanent atrial fibrillation. Monitoring to distinguish between the  two is ongoing.  2. Cardiology should be contacted to discuss the case.  3. No indication for antiplatelet therapy concomitant with Eliquis from a neurological standpoint.  4. Statin 5. CTA of head and neck 6. TTE 7. PT/OT/Speech 8. Cardiac telemetry 9. Permissive HTN 10. Stroke Team to follow in the AM.     Electronically signed: Dr. Kerney Elbe 06/20/2021, 12:57 PM

## 2021-06-20 NOTE — ED Triage Notes (Signed)
Pt arrived to ED via POV w/ c/o stroke-like s/s. Pt reports dizziness and numbness to R side of face from nose down. ZTI 4580 while riding in the truck w/ her husband. Pt w/ hx of paroxysmal A-fib, not on blood thinners. Code Stroke was activated in triage.

## 2021-06-20 NOTE — ED Notes (Signed)
ED TO INPATIENT HANDOFF REPORT  ED Nurse Name and Phone #: Sammuel Bailiff, RN [phone])  S Name/Age/Gender Kim Irwin 55 y.o. female Room/Bed: H012C/H012C  Code Status   Code Status: Prior  Home/SNF/Other Patient oriented to: self, place, time, and situation Is this baseline? Yes      Chief Complaint Acute left-sided weakness [R53.1]  Triage Note Pt arrived to ED via POV w/ c/o stroke-like s/s. Pt reports dizziness and numbness to R side of face from nose down. KVQ 2595 while riding in the truck w/ her husband. Pt w/ hx of paroxysmal A-fib, not on blood thinners. Code Stroke was activated in triage.    Allergies Allergies  Allergen Reactions   Macrobid [Nitrofurantoin] Other (See Comments)    Flu like symptoms    Level of Care/Admitting Diagnosis ED Disposition     ED Disposition  Admit   Condition  --   [place]: [facility] [[id number]]  Level of Care: Telemetry Medical [104]  May admit patient to [facility] or [facility] if equivalent level of care is available:: Yes  Covid Evaluation: Asymptomatic Screening Protocol (No Symptoms)  Diagnosis: Acute left-sided weakness [360757]  Admitting Physician: Lenoria Chime [[id number]]  Attending Physician: Lenoria Chime [[id number]]  Estimated length of stay: past midnight tomorrow  Certification:: I certify this patient will need inpatient services for at least 2 midnights          B Medical/Surgery History Past Medical History:  Diagnosis Date   Allergy    macrobid   Anxiety    Arthritis    hands   GERD (gastroesophageal reflux disease)    Hearing loss    slight, no hearing aids   Migraine    otc med prn   Paroxysmal A-fib ([place])    Pneumonia    hx - resolved   SVD (spontaneous vaginal delivery)    x 1   Urinary tract infection    Hx - resolved   Past Surgical History:  Procedure Laterality Date   COLONOSCOPY  09/2003   Normal - [facility]   endometrial ablation      TUBAL LIGATION     WRIST SURGERY Right      A IV Location/Drains/Wounds Patient Lines/Drains/Airways Status     Active Line/Drains/Airways     Name Placement date Placement time Site Days   Peripheral IV 06/20/21 20 G Right Antecubital 06/20/21  1128  Antecubital  less than 1            Intake/Output Last 24 hours No intake or output data in the 24 hours ending 06/20/21 1558  Labs/Imaging Results for orders placed or performed during the hospital encounter of 06/20/21 (from the past 48 hour(s))  Protime-INR     Status: None   Collection Time: 06/20/21 10:47 AM  Result Value Ref Range   Prothrombin Time 13.0 11.4 - 15.2 seconds   INR 1.0 0.8 - 1.2    Comment: (NOTE) INR goal varies based on device and disease states. Performed at [facility], [place] [address] [place], [place] [address]   APTT     Status: None   Collection Time: 06/20/21 10:47 AM  Result Value Ref Range   aPTT 27 24 - 36 seconds    Comment: Performed at [facility] [address], [place], [place] [address]  CBC     Status: None   Collection Time: 06/20/21 10:47 AM  Result Value Ref Range  WBC 8.6 4.0 - 10.5 K/uL   RBC 4.66 3.87 - 5.11 MIL/uL   Hemoglobin 14.8 12.0 - 15.0 g/dL   HCT 44.5 36.0 - 46.0 %   MCV 95.5 80.0 - 100.0 fL   MCH 31.8 26.0 - 34.0 pg   MCHC 33.3 30.0 - 36.0 g/dL   RDW 12.7 11.5 - 15.5 %   Platelets 276 150 - 400 K/uL   nRBC 0.0 0.0 - 0.2 %    Comment: Performed at [facility], [place] [address] [place], [place] [address]  Differential     Status: None   Collection Time: 06/20/21 10:47 AM  Result Value Ref Range   Neutrophils Relative % 48 %   Neutro Abs 4.1 1.7 - 7.7 K/uL   Lymphocytes Relative 38 %   Lymphs Abs 3.3 0.7 - 4.0 K/uL   Monocytes Relative 11 %   Monocytes Absolute 0.9 0.1 - 1.0 K/uL   Eosinophils Relative 2 %   Eosinophils Absolute 0.2 0.0 - 0.5 K/uL   Basophils Relative 1 %   Basophils Absolute 0.1 0.0 - 0.1 K/uL   Immature  Granulocytes 0 %   Abs Immature Granulocytes 0.03 0.00 - 0.07 K/uL    Comment: Performed at [facility] [address] [place], [place] [address]  Comprehensive metabolic panel     Status: None   Collection Time: 06/20/21 10:47 AM  Result Value Ref Range   Sodium 139 135 - 145 mmol/L   Potassium 3.7 3.5 - 5.1 mmol/L   Chloride 105 98 - 111 mmol/L   CO2 22 22 - 32 mmol/L   Glucose, Bld 90 70 - 99 mg/dL    Comment: Glucose reference range applies only to samples taken after fasting for at least 8 hours.   BUN 16 6 - 20 mg/dL   Creatinine, Ser 0.81 0.44 - 1.00 mg/dL   Calcium 9.9 8.9 - 10.3 mg/dL   Total Protein 7.6 6.5 - 8.1 g/dL   Albumin 4.2 3.5 - 5.0 g/dL   AST 29 15 - 41 U/L   ALT 21 0 - 44 U/L   Alkaline Phosphatase 47 38 - 126 U/L   Total Bilirubin 0.7 0.3 - 1.2 mg/dL   GFR, Estimated >60 >60 mL/min    Comment: (NOTE) Calculated using the CKD-EPI Creatinine Equation (2021)    Anion gap 12 5 - 15    Comment: Performed at [facility] [address], [place], [place] [address]  CBG monitoring, ED     Status: None   Collection Time: 06/20/21 10:49 AM  Result Value Ref Range   Glucose-Capillary 97 70 - 99 mg/dL    Comment: Glucose reference range applies only to samples taken after fasting for at least 8 hours.  I-stat chem 8, ED     Status: None   Collection Time: 06/20/21 10:54 AM  Result Value Ref Range   Sodium 141 135 - 145 mmol/L   Potassium 4.0 3.5 - 5.1 mmol/L   Chloride 107 98 - 111 mmol/L   BUN 18 6 - 20 mg/dL   Creatinine, Ser 0.80 0.44 - 1.00 mg/dL   Glucose, Bld 86 70 - 99 mg/dL    Comment: Glucose reference range applies only to samples taken after fasting for at least 8 hours.   Calcium, Ion 1.17 1.15 - 1.40 mmol/L   TCO2 24 22 - 32 mmol/L   Hemoglobin 15.0 12.0 - 15.0 g/dL   HCT 44.0 36.0 -  46.0 %  I-Stat beta hCG blood, ED     Status: None   Collection Time: 06/20/21 10:54 AM  Result Value Ref Range   I-stat hCG, quantitative <5.0 <5  mIU/mL   Comment 3            Comment:   GEST. AGE      CONC.  (mIU/mL)   <=1 WEEK        5 - 50     2 WEEKS       50 - 500     3 WEEKS       100 - 10,000     4 WEEKS     1,000 - 30,000        FEMALE AND NON-PREGNANT FEMALE:     LESS THAN 5 mIU/mL   Resp Panel by RT-PCR (Flu A&B, Covid) Nasopharyngeal Swab     Status: None   Collection Time: 06/20/21 11:46 AM   Specimen: Nasopharyngeal Swab; Nasopharyngeal(NP) swabs in vial transport medium  Result Value Ref Range   SARS Coronavirus 2 by RT PCR NEGATIVE NEGATIVE    Comment: (NOTE) SARS-CoV-2 target nucleic acids are NOT DETECTED.  The SARS-CoV-2 RNA is generally detectable in upper respiratory specimens during the acute phase of infection. The lowest concentration of SARS-CoV-2 viral copies this assay can detect is 138 copies/mL. A negative result does not preclude SARS-Cov-2 infection and should not be used as the sole basis for treatment or other patient management decisions. A negative result may occur with  improper specimen collection/handling, submission of specimen other than nasopharyngeal swab, presence of viral mutation(s) within the areas targeted by this assay, and inadequate number of viral copies(<138 copies/mL). A negative result must be combined with clinical observations, patient history, and epidemiological information. The expected result is Negative.  Fact Sheet for Patients:  EntrepreneurPulse.com.au  Fact Sheet for Healthcare Providers:  IncredibleEmployment.be  This test is no t yet approved or cleared by the [place] FDA and  has been authorized for detection and/or diagnosis of SARS-CoV-2 by FDA under an Emergency Use Authorization (EUA). This EUA will remain  in effect (meaning this test can be used) for the duration of the COVID-19 declaration under Section 564(b)(1) of the Act, 21 U.S.C.section 360bbb-3(b)(1), unless the authorization is terminated  or  revoked sooner.       Influenza A by PCR NEGATIVE NEGATIVE   Influenza B by PCR NEGATIVE NEGATIVE    Comment: (NOTE) The Xpert Xpress SARS-CoV-2/FLU/RSV plus assay is intended as an aid in the diagnosis of influenza from Nasopharyngeal swab specimens and should not be used as a sole basis for treatment. Nasal washings and aspirates are unacceptable for Xpert Xpress SARS-CoV-2/FLU/RSV testing.  Fact Sheet for Patients: EntrepreneurPulse.com.au  Fact Sheet for Healthcare Providers: IncredibleEmployment.be  This test is not yet approved or cleared by the [place] FDA and has been authorized for detection and/or diagnosis of SARS-CoV-2 by FDA under an Emergency Use Authorization (EUA). This EUA will remain in effect (meaning this test can be used) for the duration of the COVID-19 declaration under Section 564(b)(1) of the Act, 21 U.S.C. section 360bbb-3(b)(1), unless the authorization is terminated or revoked.  Performed at [facility], [place] [address] [place], [place] [address]    CT HEAD CODE STROKE WO CONTRAST  Result Date: 06/20/2021 CLINICAL DATA:  Code stroke.  55 year old female. EXAM: CT HEAD WITHOUT CONTRAST TECHNIQUE: Contiguous axial images were obtained from the base of the skull through the  vertex without intravenous contrast. COMPARISON:  None. FINDINGS: Brain: Cerebral volume is within normal limits for age. No midline shift, ventriculomegaly, mass effect, evidence of mass lesion, intracranial hemorrhage or evidence of cortically based acute infarction. Gray-white matter differentiation is within normal limits throughout the brain. No encephalomalacia identified. Vascular: No suspicious intracranial vascular hyperdensity. Skull: Negative. Sinuses/Orbits: Visualized paranasal sinuses and mastoids are clear. Other: Visualized orbit soft tissues are within normal limits. Visualized scalp soft tissues are within normal limits.  ASPECTS [facility] Stroke Program Early CT Score) Total score (0-10 with 10 being normal): 10 IMPRESSION: 1. Normal for age non contrast CT appearance of the brain. ASPECTS 10. 2. These results were communicated to Dr. Cheral Marker at 11:05 am on 06/20/2021 by text page via the [facility] messaging system. Electronically Signed   By: Genevie Ann M.D.   On: 06/20/2021 11:05    Pending Labs Unresulted Labs (From admission, onward)    None       Vitals/Pain Today's Vitals   06/20/21 1400 06/20/21 1415 06/20/21 1430 06/20/21 1445  BP: (!) 133/92   114/73  Pulse:   (!) 109 (!) 110  Resp: 18 18 15 15   Temp:      TempSrc:      SpO2: 91% 95% 94% 99%  Weight:      Height:      PainSc:    0-No pain    Isolation Precautions No active isolations  Medications Medications  sodium chloride flush (NS) 0.9 % injection 3 mL (3 mLs Intravenous Given 06/20/21 1129)  aspirin tablet 325 mg (325 mg Oral Given 06/20/21 1336)    Mobility walks Low fall risk   Focused Assessments Neuro Assessment Handoff:  Swallow screen pass? Yes  Cardiac Rhythm: Atrial fibrillation NIH Stroke Scale ( + Modified Stroke Scale Criteria)  Interval: Initial Level of Consciousness (1a.)   : Alert, keenly responsive LOC Questions (1b. )   +: Answers both questions correctly LOC Commands (1c. )   + : Performs both tasks correctly Best Gaze (2. )  +: Normal Visual (3. )  +: No visual loss Facial Palsy (4. )    : Normal symmetrical movements Motor Arm, Left (5a. )   +: No drift Motor Arm, Right (5b. )   +: No drift Motor Leg, Left (6a. )   +: No drift Motor Leg, Right (6b. )   +: No drift Limb Ataxia (7. ): Absent Sensory (8. )   +: Normal, no sensory loss Best Language (9. )   +: No aphasia Dysarthria (10. ): Normal Extinction/Inattention (11.)   +: No Abnormality Modified SS Total  +: 0 Complete NIHSS TOTAL: 0 Last date known well: 06/20/21 Last time known well: 1030 Neuro Assessment: Within Defined Limits Neuro Checks:    Initial (06/20/21 1047)  Last Documented NIHSS Modified Score: 0 (06/20/21 1400) Has TPA been given? No If patient is a Neuro Trauma and patient is going to OR before floor call report to [facility] nurse: [phone] or [phone]   R Recommendations: See Admitting Provider Note  Report given to: Jacolyn Reedy RN  Additional Notes:

## 2021-06-20 NOTE — ED Notes (Signed)
Pt placed on tele box

## 2021-06-20 NOTE — ED Notes (Signed)
Pt transported to MRI 

## 2021-06-20 NOTE — ED Notes (Signed)
[facility] called to activate code stroke, per RN Cherylann Banas request. Spoke with Nunzio Cobbs at [facility].

## 2021-06-20 NOTE — ED Notes (Signed)
Pt given turkey sandwich

## 2021-06-20 NOTE — Progress Notes (Signed)
FPTS Brief Progress Note  S: Went to bedside for evening rounds-- patient sleeping comfortably. I did not wake her. Spoke with primary RN who has no concerns at this time.  O: BP 119/71 (BP Location: Left Arm)   Pulse 71   Temp 97.7 F (36.5 C) (Oral)   Resp 16   Ht 5\' 8"  (1.727 m)   Wt 72.6 kg   SpO2 100%   BMI 24.33 kg/m   Gen: sleeping comfortably  A/P: Paroxysmal A-Fib Patient currently in NSR with HR in the 60-70s. -Discuss with cardiology tomorrow morning  Stroke vs TIA Imaging negative for acute stroke thus far (CT head, MRI brain). Symptoms of perioral tingling and dizziness could also be anxiety or related to her a-fib w/RVR. -Neurology following, appreciate recommendations -f/u echo, CTA head/neck  Alcohol Use No evidence of withdrawal. Most recent CIWA 0. -CIWA monitoring  - Orders reviewed. Labs for AM ordered, which was adjusted as needed.    Alcus Dad, MD 06/20/2021, 11:59 PM PGY-2, [facility] Night Resident  Please page [phone] with questions.

## 2021-06-20 NOTE — H&P (Addendum)
[facility] Admission History and Physical Service Pager: [phone]  Patient name: Kim Irwin Irwin Medical record number: [id number] Date of birth: 1966/04/24 Age: 55 y.o. Gender: female  Primary Care Provider: Everardo Beals, NP Consultants: Neuro Code Status: Partial: Yes to compressions, must speak to family to decide on intubation   Preferred Emergency Contact:  Contact Information     Name Relation Home Work [place] Spouse Kim Irwin Mother [phone]         Chief Complaint: new onset of right sided numbness   Assessment and Plan: Kim Irwin is a 55 y.o. female presenting with acute right sided facial and arm numbness. PMH is significant for Afib with RVR (not on anticoagulation).   Acute Right sided Weakness  Stroke vs TIA  Pt presented to the ED with right sided arm and facial numbness, confusion, and dizziness since 10:30 this AM. She has a history of Afib with RVR and is not on anticoagulation.  After onset of symptoms, the patient was brought to the ED by her husband.  Once in the emergency room, code stroke was initiated and neuro was consulted.  Dr. Cheral Marker evaluated the patient, and she had a CT scan that was negative for bleed and showed no signs of stroke or TIA.  CMP, CBC, APTT, pro time and INR were all normal.  CBG on admission was 97.  Cause of onset of symptoms is possibly from untreated A. Fib that caused possible TIA vs stroke.  Since her symptoms have dissipated, neuro suggested no fibrinolytics at this time.  On exam to assess patient, patient showed no neurologic deficits.  Patient will likely need anticoagulation.  We will start the patient on 325 mg aspirin and will contact neuro about possible antiplatelet therapy versus Eliquis.    - Admit to med tele on inpatient service, Dr. Thompson Grayer attending  - 325 mg of ASA today  - Appreciate neuro recommendations  - MRI w/o contrast  ordered  - PT/OT to evaluate and treat - VS per floor protocol  - Daily CBC and BMP   Paroxysmal Afib Afib with RVR first diagnosed in 08/2020, and at that time, she received cardioversion x2 that failed in addition to IV cardizem and amiodarone. She had a CHADS-VASC of one in February 2022 and was given the option to start anticoagulation, which she declined. Echo was received then and findings showed left ventricular ejection fraction 60 to 65% with no regional wall motion abnormalities. She has prescribed diltiazem 120 mg daily, but she has not been taking it. Echo with bubble study ordered to evaluate for PFO and to evaluate if she has a clot.  CHAD-VASC is still 1 but will increase to 3 if patient has evidence of TIA/stroke.  -Ordered Echo  -Will start anti-coagulation once echo returns and neuro provides recommendations  -d/c OTC supplements  -Consider restarting diltiazem after echo is complete   Alcohol Use  She drinks several glasses of wine daily for quite some time. No evidence of withdrawal on physical exam -CIWA scoring  -Will educate on alcohol use   FEN/GI: NPO Prophylaxis: ASA, will likely add additional anticoagulation  Disposition: med-tele   History of Present Illness:  Kim Irwin is a 55 y.o. female with PMHx of Afib with RVR presenting with acute onset of right sided numbness and confusion around 1030 this morning while she was in the passenger seat in her truck with her husband.  She  reports that she was always able to articulate her words as the symptoms started but was confused and dizzy as well as overall not feeling well.  She reports that she thought she saw some facial drooping as well as twitching on the right side.  She was able to use her right arm but did feel slightly weak.  They decided to go to the emergency room, and she notes that her symptoms dissipated approximately 30 minutes after they arrived to the emergency room.  These feelings have happened  before.  Similar symptoms occurred in July, and the patient was found to be in A. fib at that time.  When discussing her A. fib, patient reports that she has not been taking her diltiazem 120 mg once a day since February.  She was given the option whether she wanted to start anticoagulation and declined.  She has had an instance in February when she was in A. fib and was able to convert back to sinus rhythm after 2 cardioversions and IV amiodarone as well as diltiazem.  Another instance of Afib in July happened, and she left the hospital in A. fib.  Patient does report that she has been taking supplementation that she thinks helps with her A. fib after she has researched this online.  She is taking several different B vitamins, magnesium 400 mg twice daily, and potassium (unknown dosage and unknown where she gets this).  Review Of Systems: Per HPI with the following additions:   Review of Systems  Constitutional:  Positive for fatigue.  HENT:  Negative for congestion.   Respiratory:  Negative for cough, chest tightness and shortness of breath.   Cardiovascular:  Negative for chest pain, palpitations and leg swelling.  Gastrointestinal:  Negative for abdominal pain, constipation, diarrhea, nausea and vomiting.  Genitourinary:  Negative for difficulty urinating, dysuria, frequency and urgency.  Neurological:  Positive for dizziness, weakness and numbness. Negative for syncope and speech difficulty.  Psychiatric/Behavioral:  Positive for confusion and decreased concentration.     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/29/2019    Acute pansinusitis 01/29/2019   Carpal tunnel syndrome 01/29/2019   Epigastric pain 01/29/2019   Injury of hand 01/29/2019   Spasm 01/29/2019   Bilateral carpal tunnel syndrome 01/03/2019   Right shoulder injury 05/27/2017   Constipation 09/28/2011   Bleeding internal hemorrhoids 09/28/2011   Health care maintenance 08/20/2011    Past Medical History: Past Medical History:  Diagnosis Date   Allergy    macrobid   Anxiety    Arthritis    hands   GERD (gastroesophageal reflux disease)    Hearing loss    slight, no hearing aids   Migraine    otc med prn   Paroxysmal A-fib (HCC)    Pneumonia    hx - resolved   SVD (spontaneous vaginal delivery)    x 1   Urinary tract infection    Hx - resolved    Past Surgical History: Past Surgical History:  Procedure Laterality Date   COLONOSCOPY  09/2003   Normal - [facility]   endometrial ablation     TUBAL LIGATION  WRIST SURGERY Right     Social History: Social History   Tobacco Use   Smoking status: Former    Packs/day: 0.20    Years: 15.00    Pack years: 3.00    Types: Cigarettes    Quit date: 07/18/1996    Years since quitting: 24.9   Smokeless tobacco: Never  Substance Use Topics   Alcohol use: Yes    Alcohol/week: 21.0 standard drinks    Types: 21 Glasses of wine per week    Comment: 3 glasses of wine daily   Drug use: Yes    Comment: Phentermine, Tramadol   Additional social history:  Lives at home with her husband, stepson, and a new cat  Drinks several glasses of wine a day  Rarely drinks hard liquor or beer  No use of tobacco products or illicit drugs   Please also refer to relevant sections of EMR.  Family History: Family History  Problem Relation Age of Onset   Hyperlipidemia Father    Asthma Father    Other Mother    Heart disease Mother    Stomach cancer Paternal Grandfather    Colon cancer Neg Hx    Esophageal cancer Neg Hx    Rectal cancer Neg Hx    Breast cancer Neg Hx    Allergies and  Medications: Allergies  Allergen Reactions   Macrobid [Nitrofurantoin] Other (See Comments)    Flu like symptoms   No current facility-administered medications on file prior to encounter.   Current Outpatient Medications on File Prior to Encounter  Medication Sig Dispense Refill   Coenzyme Q10 (COQ10) 200 MG CAPS Take 1 capsule by mouth daily.     loratadine (CLARITIN) 10 MG tablet Take 10 mg by mouth daily.     Magnesium 200 MG TABS Take 1 tablet by mouth daily.     omega-3 acid ethyl esters (LOVAZA) 1 g capsule Take 1 g by mouth 2 (two) times daily.     omeprazole (PRILOSEC) 20 MG capsule Take 20 mg by mouth daily.     pyridOXINE (VITAMIN B-6) 100 MG tablet Take 100 mg by mouth daily.     Tetrahydrozoline HCl (VISINE OP) Place 1 drop into both eyes daily as needed (dry eyes).     thiamine (VITAMIN B-1) 100 MG tablet Take 100 mg by mouth daily.     Turmeric (QC TUMERIC COMPLEX PO) Take 1 tablet by mouth daily.     diltiazem (CARDIZEM CD) 120 MG 24 hr capsule Take 1 capsule (120 mg total) by mouth daily. (Patient not taking: Reported on 09/29/2020) 30 capsule 2    Objective: BP 126/89   Pulse (!) 102   Temp 98.2 F (36.8 C) (Oral)   Resp 13   Ht 5\' 8"  (1.727 m)   Wt 72.6 kg   SpO2 96%   BMI 24.33 kg/m  Exam: General: NAD, awake and alert, Pleasant  HEENT: Ocular movements intact, [place]/AT, no facial drooping, external ears nml  Neck: No LAD Cardiovascular: Irregular rhythm, slightly tachycardic, no m/g/r Respiratory: CTAB Gastrointestinal: Nondistended, soft and nontender to palpation with BS present MSK: FROM Derm: No rashes or erythema  Neuro: Alert and able ot follow commands. No evident facial drooping, cranial nerve deficits, muscle strength equal bilaterally  Psych: Appropriate mood and behavior   Labs and Imaging: CBC BMET  Recent Labs  Lab 06/20/21 1047 06/20/21 1054  WBC 8.6  --   HGB 14.8 15.0  HCT 44.5 44.0  PLT 276  --  Recent Labs  Lab 06/20/21 1047  06/20/21 1054  NA 139 141  K 3.7 4.0  CL 105 107  CO2 22  --   BUN 16 18  CREATININE 0.81 0.80  GLUCOSE 90 86  CALCIUM 9.9  --      EKG: My own interpretation (not copied from electronic read): AFib    Erskine Emery, MD 06/20/2021, 2:42 PM PGY-1, [facility] Intern pager: [phone], text pages welcome

## 2021-06-20 NOTE — Plan of Care (Signed)
Patient received from ED, A&Ox4, VSS, no acute distress noticed, will continue to monitor.

## 2021-06-21 ENCOUNTER — Inpatient Hospital Stay (HOSPITAL_BASED_OUTPATIENT_CLINIC_OR_DEPARTMENT_OTHER): Payer: BC Managed Care – PPO

## 2021-06-21 ENCOUNTER — Other Ambulatory Visit (HOSPITAL_COMMUNITY): Payer: Self-pay

## 2021-06-21 DIAGNOSIS — I480 Paroxysmal atrial fibrillation: Secondary | ICD-10-CM | POA: Diagnosis present

## 2021-06-21 DIAGNOSIS — I4891 Unspecified atrial fibrillation: Secondary | ICD-10-CM | POA: Diagnosis present

## 2021-06-21 DIAGNOSIS — R531 Weakness: Secondary | ICD-10-CM | POA: Diagnosis not present

## 2021-06-21 DIAGNOSIS — G459 Transient cerebral ischemic attack, unspecified: Secondary | ICD-10-CM | POA: Diagnosis not present

## 2021-06-21 LAB — LIPID PANEL
Cholesterol: 178 mg/dL (ref 0–200)
HDL: 69 mg/dL (ref >40)
LDL Cholesterol: 95 mg/dL (ref 0–99)
Total CHOL/HDL Ratio: 2.6 RATIO
Triglycerides: 68 mg/dL (ref <150)
VLDL: 14 mg/dL (ref 0–40)

## 2021-06-21 LAB — BASIC METABOLIC PANEL
Anion gap: 5 (ref 5–15)
BUN: 21 mg/dL — ABNORMAL HIGH (ref 6–20)
CO2: 27 mmol/L (ref 22–32)
Calcium: 9 mg/dL (ref 8.9–10.3)
Chloride: 104 mmol/L (ref 98–111)
Creatinine, Ser: 0.82 mg/dL (ref 0.44–1.00)
GFR, Estimated: >60 mL/min (ref >60)
Glucose, Bld: 94 mg/dL (ref 70–99)
Potassium: 4.8 mmol/L (ref 3.5–5.1)
Sodium: 136 mmol/L (ref 135–145)

## 2021-06-21 LAB — ECHOCARDIOGRAM COMPLETE BUBBLE STUDY
AR max vel: 1.61 cm2
AV Area VTI: 1.52 cm2
AV Area mean vel: 1.51 cm2
AV Mean grad: 4 mmHg
AV Peak grad: 6.6 mmHg
Ao pk vel: 1.28 m/s
Area-P 1/2: 3.74 cm2
Calc EF: 58.9 %
S' Lateral: 2.7 cm
Single Plane A2C EF: 56.4 %
Single Plane A4C EF: 61.2 %

## 2021-06-21 LAB — HEMOGLOBIN A1C
Hgb A1c MFr Bld: 5.4 % (ref 4.8–5.6)
Mean Plasma Glucose: 108.28 mg/dL

## 2021-06-21 LAB — MAGNESIUM: Magnesium: 2.1 mg/dL (ref 1.7–2.4)

## 2021-06-21 LAB — TSH: TSH: 2.557 u[IU]/mL (ref 0.350–4.500)

## 2021-06-21 MED ORDER — MELATONIN 3 MG PO TABS
3.0000 mg | ORAL_TABLET | Freq: Every day | ORAL | Status: DC
  Administered 2021-06-21: 3 mg via ORAL
  Filled 2021-06-21: qty 1

## 2021-06-21 MED ORDER — ATORVASTATIN CALCIUM 40 MG PO TABS: 40.0000 mg | ORAL_TABLET | Freq: Every day | ORAL | Status: DC

## 2021-06-21 MED ORDER — APIXABAN 5 MG PO TABS: 5.0000 mg | ORAL_TABLET | Freq: Two times a day (BID) | ORAL | Status: DC

## 2021-06-21 MED ORDER — APIXABAN 5 MG PO TABS: 5.0000 mg | ORAL_TABLET | Freq: Two times a day (BID) | ORAL | 3 refills | Status: DC

## 2021-06-21 MED ORDER — ATORVASTATIN CALCIUM 40 MG PO TABS: 40.0000 mg | ORAL_TABLET | Freq: Every day | ORAL | 3 refills | Status: DC

## 2021-06-21 NOTE — Progress Notes (Signed)
Pt has been discharged from the unit via wheelchair. All belongings has been sent and AVS has been reviewed. Pt sent in good spirits.

## 2021-06-21 NOTE — Progress Notes (Signed)
Family Medicine Teaching Service Daily Progress Note Intern Pager: [phone]  Patient name: Kim Irwin Medical record number: [id number] Date of birth: 08/03/1965 Age: 55 y.o. Gender: female  Primary Care Provider: Everardo Beals, NP Consultants: Neuro Code Status: Partial: Yes to compressions, will speak to husband for intubation  Pt Overview and Major Events to Date:  12/4: admitted to FPTS  Assessment and Plan: Kim Irwin is a 55 y.o. female w/ acute right-sided facial and arm numbness.  Past medical history significant for A. fib with RVR (not on anticoagulation).  Acute right-sided weakness  stroke versus TIA Patient presented to ED with right-sided arm and facial numbness, confusion, dizziness.  History of A. fib with RVR not on anticoagulation.  Stroke work-up negative on CT and MR.  Labs overall normal. Neurology recommends the following: Eliquis, cardiology consult, statin, CTA head/neck, TTE, PT/OT/speech, cardiac telemetry, permissive hypertension. ASCVD risk score is 1.5%. Will start statin due to hx of TIA.  -Neurology following, appreciate recs -PT/OT following, appreciate assistance -Lipid panel, TSH -Follow up CTA head/neck -Follow-up echo -Start Atorvastatin 40mg  daily  Paroxysmal A. fib A. fib with RVR first diagnosed in 08/2020 and failed cardioversion x2 in addition to IV Cardizem and amiodarone.  Given the option to start anticoagulation at that time which was declined.  Echo at that time showed LVEF 60 to 65% without regional wall motion abnormalities.  Patient is not on any active medication for control.  She is not in A. fib at this time. -Cardiology consulted, to see today, appreciate recs  Alcohol use History of drinking several glasses of wine daily.  Last CIWA score @2000  was 0 patient slept well overnight.  FEN/GI: Heart healthy PPx: SCDs Dispo: Home today pending cardiology and neurology recommendations.  Patient is stable at this  time.  Subjective:  Patient is doing well today.  She notes that she is now willing to try medications as opposed to her previous at home remedies.  She is not able to usually tell when she is in A. fib.  Objective: Temp:  [97.7 F (36.5 C)-98.3 F (36.8 C)] 98 F (36.7 C) (12/05 0419) Pulse Rate:  [64-110] 65 (12/05 0419) Resp:  [13-20] 16 (12/05 0419) BP: (114-148)/(70-97) 118/70 (12/05 0419) SpO2:  [91 %-100 %] 98 % (12/05 0419) Weight:  [72.6 kg] 72.6 kg (12/04 1145) Physical Exam: General: WDWN, NAD Cardiovascular: RRR, no murmurs auscultated Respiratory: CTAB, no increased work of breathing  Laboratory: Recent Labs  Lab 06/20/21 1047 06/20/21 1054  WBC 8.6  --   HGB 14.8 15.0  HCT 44.5 44.0  PLT 276  --    Recent Labs  Lab 06/20/21 1047 06/20/21 1054 06/21/21 0049  NA 139 141 136  K 3.7 4.0 4.8  CL 105 107 104  CO2 22  --  27  BUN 16 18 21*  CREATININE 0.81 0.80 0.82  CALCIUM 9.9  --  9.0  PROT 7.6  --   --   BILITOT 0.7  --   --   ALKPHOS 47  --   --   ALT 21  --   --   AST 29  --   --   GLUCOSE 90 86 94   Imaging/Diagnostic Tests: MR BRAIN WO CONTRAST  Result Date: 06/20/2021 CLINICAL DATA:  TIA EXAM: MRI HEAD WITHOUT CONTRAST TECHNIQUE: Multiplanar, multiecho pulse sequences of the brain and surrounding structures were obtained without intravenous contrast. COMPARISON:  None. FINDINGS: Brain: There is no acute infarction or intracranial  hemorrhage. There is no intracranial mass, mass effect, or edema. There is no hydrocephalus or extra-axial fluid collection. Ventricles and sulci are normal in size and configuration. Vascular: Major vessel flow voids at the skull base are preserved. Skull and upper cervical spine: Normal marrow signal is preserved. Sinuses/Orbits: Paranasal sinuses are aerated. Orbits are unremarkable. Other: Sella is unremarkable.  Mastoid air cells are clear. IMPRESSION: No acute infarction, hemorrhage, or mass. Electronically Signed    By: Macy Mis M.D.   On: 06/20/2021 16:40   CT HEAD CODE STROKE WO CONTRAST  Result Date: 06/20/2021 CLINICAL DATA:  Code stroke.  55 year old female. EXAM: CT HEAD WITHOUT CONTRAST TECHNIQUE: Contiguous axial images were obtained from the base of the skull through the vertex without intravenous contrast. COMPARISON:  None. FINDINGS: Brain: Cerebral volume is within normal limits for age. No midline shift, ventriculomegaly, mass effect, evidence of mass lesion, intracranial hemorrhage or evidence of cortically based acute infarction. Gray-white matter differentiation is within normal limits throughout the brain. No encephalomalacia identified. Vascular: No suspicious intracranial vascular hyperdensity. Skull: Negative. Sinuses/Orbits: Visualized paranasal sinuses and mastoids are clear. Other: Visualized orbit soft tissues are within normal limits. Visualized scalp soft tissues are within normal limits. ASPECTS [facility] Stroke Program Early CT Score) Total score (0-10 with 10 being normal): 10 IMPRESSION: 1. Normal for age non contrast CT appearance of the brain. ASPECTS 10. 2. These results were communicated to Dr. Cheral Marker at 11:05 am on 06/20/2021 by text page via the [facility] messaging system. Electronically Signed   By: Genevie Ann M.D.   On: 06/20/2021 11:05    Wells Guiles, DO 06/21/2021, 7:29 AM PGY-1, [facility] Intern pager: [phone], text pages welcome

## 2021-06-21 NOTE — Progress Notes (Signed)
PT Cancellation Note  Patient Details Name: Kim Irwin MRN: [id number] DOB: 1966/05/17   Cancelled Treatment:    Reason Eval/Treat Not Completed: PT screened, no needs identified, will sign off. OT reports pt is independent and all symptoms pt presented to ED with have resolved. Pt with no need for skilled PT at this time. Please re-consult if PT needed in future.  Kittie Plater, PT, DPT Acute Rehabilitation Services Pager #: [phone] Office #: [phone]    Berline Lopes 06/21/2021, 11:11 AM

## 2021-06-21 NOTE — Discharge Summary (Addendum)
[facility] Discharge Summary  Patient name: Kim Irwin Medical record number: [id number] Date of birth: Apr 21, 1966 Age: 55 y.o. Gender: female Date of Admission: 06/20/2021  Date of Discharge: 06/21/2021 Admitting Physician: Lenoria Chime, MD  Primary Care Provider: Everardo Beals, NP Consultants: Neurology, cardiology  Indication for Hospitalization: TIA  Discharge Diagnoses/Problem List:  Principal Problem:   TIA (transient ischemic attack) Active Problems:   Atrial fibrillation [facility])  Disposition: Home  Discharge Condition: Stable  Discharge Exam:  Blood pressure 129/87, pulse 64, temperature 97.6 F (36.4 C), temperature source Oral, resp. rate 18, height 5\' 8"  (1.727 m), weight 72.6 kg, SpO2 100 %.  General: WDWN, NAD Cardiovascular: RRR, no murmurs auscultated Respiratory: CTA B, no increased work of breathing  Brief Hospital Course:  Kim Irwin is a 55 y.o.female with a history of A. fib with RVR not on anticoagulation who was admitted to the [facility] at [facility] for right-sided facial and arm numbness. Her hospital course is detailed below:  Acute right-sided weakness  TIA Patient presented to the ED with right-sided arm and facial numbness, confusion, dizziness.  Symptoms dissipated by the time of admission.  Stroke work-up with CT/MR negative.  Blood work overall unremarkable.  Echo noted LVEF 55 to 60% without regional wall motion abnormalities.  Cardiology saw patient and advised outpatient follow-up. Upon discharge, patient was not interested of CTA head/neck and deferred to pursue outpatient. Patient was discharged on atorvastatin 40 mg and Eliquis 5mg  BID.  Paroxysmal A. fib with RVR Not actively taking patient was not actively taking anticoagulation or rate control medications as previously recommended by cardiology prior to hospitalization.  Given the most recent event, cardiology advised Eliquis 5  mg twice daily.  Patient was amenable upon discharge and medication sent to pharmacy.  PCP Follow-up Recommendations: 1.  Advise discontinuation of over-the-counter supplements. 2. Consider outpatient CTA head and neck if appropriate  Significant Procedures: None  Significant Labs and Imaging:  Recent Labs  Lab 06/20/21 1047 06/20/21 1054  WBC 8.6  --   HGB 14.8 15.0  HCT 44.5 44.0  PLT 276  --    Recent Labs  Lab 06/20/21 1047 06/20/21 1054 06/21/21 0049  NA 139 141 136  K 3.7 4.0 4.8  CL 105 107 104  CO2 22  --  27  GLUCOSE 90 86 94  BUN 16 18 21*  CREATININE 0.81 0.80 0.82  CALCIUM 9.9  --  9.0  MG  --   --  2.1  ALKPHOS 47  --   --   AST 29  --   --   ALT 21  --   --   ALBUMIN 4.2  --   --    Results/Tests Pending at Time of Discharge: None  Discharge Medications:  Allergies as of 06/21/2021       Reactions   Macrobid [nitrofurantoin] Other (See Comments)   Flu like symptoms        Medication List     STOP taking these medications    diltiazem 120 MG 24 hr capsule Commonly known as: CARDIZEM CD   Magnesium 200 MG Tabs   omega-3 acid ethyl esters 1 g capsule Commonly known as: LOVAZA   pyridOXINE 100 MG tablet Commonly known as: VITAMIN B-6   thiamine 100 MG tablet Commonly known as: Vitamin B-1       TAKE these medications    apixaban 5 MG Tabs tablet Commonly known as: ELIQUIS  Take 1 tablet (5 mg total) by mouth 2 (two) times daily.   atorvastatin 40 MG tablet Commonly known as: LIPITOR Take 1 tablet (40 mg total) by mouth daily. Start taking on: June 22, 2021   CoQ10 200 MG Caps Take 1 capsule by mouth daily.   loratadine 10 MG tablet Commonly known as: CLARITIN Take 10 mg by mouth daily.   omeprazole 20 MG capsule Commonly known as: PRILOSEC Take 20 mg by mouth daily.   QC TUMERIC COMPLEX PO Take 1 tablet by mouth daily.   VISINE OP Place 1 drop into both eyes daily as needed (dry eyes).        Discharge  Instructions: Please refer to Patient Instructions section of EMR for full details.  Patient was counseled important signs and symptoms that should prompt return to medical care, changes in medications, dietary instructions, activity restrictions, and follow up appointments.   Follow-Up Appointments:   Cardiology w/ Sande Rives, PA-C: 08/17/2021  Wells Guiles, DO 06/21/2021, 4:15 PM PGY-1, [facility]    FPTS Upper-Level Resident Addendum   I have independently interviewed and examined the patient. I have discussed the above with the original author and agree with their documentation. My edits for correction/addition/clarification are in within the document. Please see also any attending notes.   Rise Patience, DO  PGY-2, [facility] 06/21/2021 6:12 PM  FPTS Service pager: [phone] (text pages welcome through [facility])

## 2021-06-21 NOTE — TOC Transition Note (Signed)
Transition of Care [facility]) - CM/SW Discharge Note   Patient Details  Name: Kim Irwin MRN: [id number] Date of Birth: 1965/09/25  Transition of Care [facility] CM/SW Contact:  Pollie Friar, RN Phone Number: 06/21/2021, 4:17 PM   Clinical Narrative:    Patient is discharging home with self care. No needs per PT/OT and no DME needs.  Pt discharging on Elquis . CM provided her 30 day free card and $10 copay card.  Pt has transportation home.   Final next level of care: Home/Self Care Barriers to Discharge: No Barriers Identified   Patient Goals and CMS Choice        Discharge Placement                       Discharge Plan and Services                                     Social Determinants of Health (SDOH) Interventions     Readmission Risk Interventions No flowsheet data found.

## 2021-06-21 NOTE — Progress Notes (Addendum)
STROKE TEAM PROGRESS NOTE   INTERVAL HISTORY Patient was seen in her room with her husband and parents at the bedside.  She states that yesterday when she was riding in the car, she noticed an episode of right sided facial tingling and numbness with tingling and numbness to the right hand as well.  During this time, she also had trouble speaking- she had difficulties getting words out but her speech was clear.  She also felt disoriented.  This episode lasted about an hour and she came directly to the hospital.  She states she had a similar spell a few weeks ago when [place] [address] [place] hospital [place] however I do not have those records to review.  MRI scan of the brain is negative for acute stroke.  Patient has history of atrial fibrillation but has not been on anticoagulation so far.  Echocardiogram and CT angiograms are pending  Vitals:   06/20/21 2028 06/21/21 0055 06/21/21 0419 06/21/21 0741  BP: 119/71 121/81 118/70 118/60  Pulse: 71 64 65 66  Resp: 16 14 16 20   Temp: 97.7 F (36.5 C) 98.1 F (36.7 C) 98 F (36.7 C) 98.1 F (36.7 C)  TempSrc: Oral Oral Oral Oral  SpO2: 100% 100% 98% 100%  Weight:      Height:       CBC:  Recent Labs  Lab 06/20/21 1047 06/20/21 1054  WBC 8.6  --   NEUTROABS 4.1  --   HGB 14.8 15.0  HCT 44.5 44.0  MCV 95.5  --   PLT 276  --    Basic Metabolic Panel:  Recent Labs  Lab 06/20/21 1047 06/20/21 1054 06/21/21 0049  NA 139 141 136  K 3.7 4.0 4.8  CL 105 107 104  CO2 22  --  27  GLUCOSE 90 86 94  BUN 16 18 21*  CREATININE 0.81 0.80 0.82  CALCIUM 9.9  --  9.0  MG  --   --  2.1   Lipid Panel:  Recent Labs  Lab 06/21/21 0818  CHOL 178  TRIG 68  HDL 69  CHOLHDL 2.6  VLDL 14  LDLCALC 95   HgbA1c:  Recent Labs  Lab 06/21/21 0818  HGBA1C 5.4   Urine Drug Screen: No results for input(s): LABOPIA, COCAINSCRNUR, LABBENZ, AMPHETMU, THCU, LABBARB in the last 168 hours.  Alcohol Level No  results for input(s): ETH in the last 168 hours.  IMAGING past 24 hours MR BRAIN WO CONTRAST  Result Date: 06/20/2021 CLINICAL DATA:  TIA EXAM: MRI HEAD WITHOUT CONTRAST TECHNIQUE: Multiplanar, multiecho pulse sequences of the brain and surrounding structures were obtained without intravenous contrast. COMPARISON:  None. FINDINGS: Brain: There is no acute infarction or intracranial hemorrhage. There is no intracranial mass, mass effect, or edema. There is no hydrocephalus or extra-axial fluid collection. Ventricles and sulci are normal in size and configuration. Vascular: Major vessel flow voids at the skull base are preserved. Skull and upper cervical spine: Normal marrow signal is preserved. Sinuses/Orbits: Paranasal sinuses are aerated. Orbits are unremarkable. Other: Sella is unremarkable.  Mastoid air cells are clear. IMPRESSION: No acute infarction, hemorrhage, or mass. Electronically Signed   By: Macy Mis M.D.   On: 06/20/2021 16:40   ECHOCARDIOGRAM COMPLETE BUBBLE STUDY  Result Date: 06/21/2021    ECHOCARDIOGRAM REPORT   Patient Name:   Kim Irwin Date of Exam: 06/21/2021 Medical Rec #:  [id number]  Height:       68.0 in Accession #:    [id number]       Weight:       160.0 lb Date of Birth:  February 15, 1966        BSA:          1.859 m Patient Age:    55 years         BP:           121/81 mmHg Patient Gender: F                HR:           74 bpm. Exam Location:  Inpatient Procedure: 2D Echo, Cardiac Doppler and Color Doppler Indications:    Afib, bubble  History:        Patient has prior history of Echocardiogram examinations, most                 recent 09/25/2020. Arrythmias:Atrial Fibrillation.  Sonographer:    Glo Herring Referring Phys: [id number] [place]  1. Left ventricular ejection fraction, by estimation, is 55 to 60%. The left ventricle has normal function. The left ventricle has no regional wall motion abnormalities. Left ventricular diastolic parameters  were normal.  2. Right ventricular systolic function is normal. The right ventricular size is normal. Tricuspid regurgitation signal is inadequate for assessing PA pressure.  3. The mitral valve is normal in structure. Mild mitral valve regurgitation. No evidence of mitral stenosis.  4. The aortic valve is tricuspid. Aortic valve regurgitation is not visualized. Aortic valve sclerosis is present, with no evidence of aortic valve stenosis.  5. The inferior vena cava is normal in size with greater than 50% respiratory variability, suggesting right atrial pressure of 3 mmHg.  6. Agitated saline contrast bubble study was negative, with no evidence of any interatrial shunt. Technically difficult bubble study, but no shunting seen. FINDINGS  Left Ventricle: Left ventricular ejection fraction, by estimation, is 55 to 60%. The left ventricle has normal function. The left ventricle has no regional wall motion abnormalities. The left ventricular internal cavity size was normal in size. There is  no left ventricular hypertrophy. Left ventricular diastolic parameters were normal. Right Ventricle: The right ventricular size is normal. No increase in right ventricular wall thickness. Right ventricular systolic function is normal. Tricuspid regurgitation signal is inadequate for assessing PA pressure. Left Atrium: Left atrial size was normal in size. Right Atrium: Right atrial size was normal in size. Pericardium: Trivial pericardial effusion is present. Mitral Valve: The mitral valve is normal in structure. Mild mitral valve regurgitation. No evidence of mitral valve stenosis. Tricuspid Valve: The tricuspid valve is normal in structure. Tricuspid valve regurgitation is trivial. Aortic Valve: The aortic valve is tricuspid. Aortic valve regurgitation is not visualized. Aortic valve sclerosis is present, with no evidence of aortic valve stenosis. Aortic valve mean gradient measures 4.0 mmHg. Aortic valve peak gradient measures 6.6   mmHg. Aortic valve area, by VTI measures 1.52 cm. Pulmonic Valve: The pulmonic valve was not well visualized. Pulmonic valve regurgitation is not visualized. Aorta: The aortic root and ascending aorta are structurally normal, with no evidence of dilitation. Venous: The inferior vena cava is normal in size with greater than 50% respiratory variability, suggesting right atrial pressure of 3 mmHg. IAS/Shunts: The interatrial septum was not well visualized. Agitated saline contrast bubble study was negative, with no evidence of any interatrial shunt.  LEFT VENTRICLE PLAX 2D LVIDd:  4.20 cm     Diastology LVIDs:         2.70 cm     LV e' medial:    9.46 cm/s LV PW:         0.90 cm     LV E/e' medial:  4.5 LV IVS:        0.90 cm     LV e' lateral:   11.90 cm/s LVOT diam:     1.90 cm     LV E/e' lateral: 3.6 LV SV:         49 LV SV Index:   26 LVOT Area:     2.84 cm  LV Volumes (MOD) LV vol d, MOD A2C: 65.3 ml LV vol d, MOD A4C: 67.8 ml LV vol s, MOD A2C: 28.5 ml LV vol s, MOD A4C: 26.3 ml LV SV MOD A2C:     36.8 ml LV SV MOD A4C:     67.8 ml LV SV MOD BP:      39.4 ml RIGHT VENTRICLE          IVC RV Basal diam:  3.00 cm  IVC diam: 2.00 cm LEFT ATRIUM             Index        RIGHT ATRIUM           Index LA diam:        3.40 cm 1.83 cm/m   RA Area:     13.40 cm LA Vol (A2C):   39.2 ml 21.09 ml/m  RA Volume:   31.30 ml  16.84 ml/m LA Vol (A4C):   47.5 ml 25.55 ml/m LA Biplane Vol: 47.0 ml 25.28 ml/m  AORTIC VALVE                    PULMONIC VALVE AV Area (Vmax):    1.61 cm     PV Vmax:       0.80 m/s AV Area (Vmean):   1.51 cm     PV Peak grad:  2.6 mmHg AV Area (VTI):     1.52 cm AV Vmax:           128.00 cm/s AV Vmean:          92.700 cm/s AV VTI:            0.321 m AV Peak Grad:      6.6 mmHg AV Mean Grad:      4.0 mmHg LVOT Vmax:         72.50 cm/s LVOT Vmean:        49.500 cm/s LVOT VTI:          0.172 m LVOT/AV VTI ratio: 0.54  AORTA Ao Root diam: 2.50 cm Ao Asc diam:  2.40 cm MITRAL VALVE MV Area  (PHT): 3.74 cm    SHUNTS MV Decel Time: 203 msec    Systemic VTI:  0.17 m MV E velocity: 42.92 cm/s  Systemic Diam: 1.90 cm MV A velocity: 50.60 cm/s MV E/A ratio:  0.85 Oswaldo Milian MD Electronically signed by Oswaldo Milian MD Signature Date/Time: 06/21/2021/10:52:05 AM    Final     PHYSICAL EXAM General: Pleasant Caucasian lady well-developed, well-nourished patient in no acute distress   NEURO:  Mental Status: AA&Ox3  Speech/Language: speech is without dysarthria or aphasia.  Naming, repetition, fluency, and comprehension intact.  Cranial Nerves:  II: PERRL. Visual fields full.  III, IV, VI: EOMI. Eyelids elevate symmetrically.  V:  Sensation is intact to light touch and symmetrical to face.  VII: Smile is symmetrical.  VIII: hearing intact to voice. IX, X: Phonation is normal.  XII: tongue is midline without fasciculations. Motor: 5/5 strength to all muscle groups tested.  Sensation- Intact to light touch bilaterally. Extinction absent to light touch to DSS.   Coordination: FTN intact bilaterally, No drift. Fine motor movements intact Gait- deferred   ASSESSMENT/PLAN Kim Irwin is a 55 y.o. female with history of atrial fibrillation not on anticoagulation presenting with right facial and arm numbness and tingling with expressive aphasia. Patient states that yesterday when she was riding in the car, she noticed an episode of right sided facial tingling and numbness with tingling and numbness to the right hand as well.  During this time, she also had trouble speaking- she had difficulties getting words out but her speech was clear.  This episode lasted about an hour and she came directly to the hospital.  CT and MRI were noted to be negative for acute abnormalities.  Patient's symptoms are consistent with a TIA. Of note, patient had a similar episode in July. Due to her history of atrial fibrillation, she will need to start anticoagulation with a DOAC.  TIA:  left  sided subcortical TIA likely possibly from small vessel disease in a patient with known atrial fibrillation Code Stroke CT head No acute abnormality. ASPECTS 10.    CTA head & neck pending MRI  No acute abnormalities 2D Echo EF 55-60%, no evidence of intertrial shunt LDL 95 HgbA1c 5.4 VTE prophylaxis - SCDs    Diet   Diet Heart Room service appropriate? Yes; Fluid consistency: Thin   No antithrombotic prior to admission, now to start [place] and remain on full anticoagulation indefinitely Therapy recommendations:  no PT/OT follow up needed Disposition:  to home  Hypertension Home meds:  none Stable Keep SBP <180 Long-term BP goal normotensive  Hyperlipidemia Home meds:  Lovaza  1gm BID, not resumed in hospital LDL 95, goal < 70 Add Atorvastatin 40 mg daily  High intensity statin initiated Continue statin at discharge  Atrial fibrillation Patient has a history of atrial fibrillation but was not taking any anticaogulation due to [place] score of 1 Will require lifelong anticoagulation with a DOAC  Other Stroke Risk Factors Former cigarette smoker ETOH use, alcohol level No results found for requested labs within last 26280 hours., advised to drink no more than 1drink(s) a day Hx TIA  Other Active Problems none  Hospital day # [place] , MSN, AGACNP-BC [facility] See Amion for schedule and pager information 06/21/2021 12:01 PM   STROKE MD NOTE : I have personally obtained history,examined this patient, reviewed notes, independently viewed imaging studies, participated in medical decision making and plan of care.ROS completed by me personally and pertinent positives fully documented  I have made any additions or clarifications directly to the above note. Agree with note above.  Patient has had recurrent transient episodes difficulties with right face and lip and fingertip numbness likely (subcortical TIAs.  She has known atrial fibrillation has  not been anticoagulation but now will need to be on lifelong anticoagulation with Eliquis.  Continue ongoing stroke work-up and aggressive risk factor modification.  Long discussion with patient, husband and her parents at the bedside and answered questions.  Greater than 50% time during the 35-minute visit was spent on counseling and coordination of care and discussion with patient and care team and  answering questions.    Antony Contras, MD Medical Director [facility] Pager: [phone] 06/21/2021 3:59 PM   To contact Stroke Continuity provider, please refer to [email or web address] After hours, contact General Neurology

## 2021-06-21 NOTE — Discharge Instructions (Signed)
Dear Susa Griffins,   Thank you for letting us participate in your care! In this section, you will find a brief hospital admission summary of why you were admitted to the hospital, what happened during your admission, your diagnosis/diagnoses, and recommended follow up.  You were admitted because you were experiencing right-sided facial and arm numbness.  Your testing revealed that this was likely what we call a transient ischemic attack (TIA) or brief resolved stroke.  You were also seen by neurology and cardiology. They recommended imaging which was performed and additionally CTA head/neck which you have opted to pursue on an outpatient basis.  Your symptoms improved and you were discharged from the hospital for meeting this goal.    POST-HOSPITAL & CARE INSTRUCTIONS Please continue to take atorvastatin 40 mg daily and Eliquis 5 mg twice daily. Please let PCP/Specialists know of any changes in medications that were made.  Please see medications section of this packet for any medication changes.   DOCTOR'S APPOINTMENTS & FOLLOW UP Future Appointments  Date Time Provider [place]  08/17/2021  8:50 AM Darreld Mclean, PA-C [facility]     Thank you for choosing [facility]! Take care and be well!  [facility]  [place], [place] [address] [phone]

## 2021-06-21 NOTE — Consult Note (Signed)
Cardiology Consultation:   Patient ID: JUDA LAJEUNESSE MRN: [id number]; DOB: 05/17/1966  Admit date: 06/20/2021 Date of Consult: 06/21/2021  PCP:  Everardo Beals, NP   [facility] Providers Cardiologist:  Donato Heinz, MD        Patient Profile:   NATALEY BAHRI is a 55 y.o. female with a hx of PAF not on anticoag (CHA2DS2-VASc =1), GERD, previously on phentermine,  who is being seen 06/21/2021 for the evaluation of Afib at the request of Dr Thompson Grayer.  History of Present Illness:   Ms. Tabares saw Dr Gardiner Rhyme 09/2020 and was doing well. No longer on phentermine and had recovered from [place], had had no more Afib sx. Off Dilt and no sx. Monitor never completed.  She was admitted 12/04 with R weakness and numbness, neg CVA by CT/MRI, dx TIA and in Afib >> Eliquis  Ms. Becvar was on her way to church when she had sudden onset of weakness and facial and right arm numbness.  She had symptoms similar to these when she had A. fib last time.  When she felt this they knew enough to come straight to the hospital.  The neurological symptoms resolved after about an hour and a half.  She does not know exactly when she converted, but says it took longer than that.  However, she does not know exactly when she went into A. fib.  Until the stroke symptoms started, she really did not know that her heart rate was irregular or going fast.  She and her husband stay busy around the house and work very strenuously at times on the weekends, but do not exercise regularly.  She never gets chest pain with exertion.  She realizes that she is a bit out of shape, but does not feel like her dyspnea on exertion is out of proportion to her fitness level.  She does not wake up with lower extremity edema, no orthopnea or PND.  She does occasionally feel palpitations and accepts that she may have been having periodic episodes of A. fib, but cannot truly say how often or how long they might have lasted.  She  never did wear the monitor that was sent to her, but is willing to wear it now.   Past Medical History:  Diagnosis Date   Allergy    macrobid   Anxiety    Arthritis    hands   GERD (gastroesophageal reflux disease)    Hearing loss    slight, no hearing aids   Migraine    otc med prn   Paroxysmal A-fib (HCC)    Pneumonia    hx - resolved   SVD (spontaneous vaginal delivery)    x 1   Urinary tract infection    Hx - resolved    Past Surgical History:  Procedure Laterality Date   COLONOSCOPY  09/2003   Normal - [facility]   endometrial ablation     TUBAL LIGATION     WRIST SURGERY Right      Home Medications:  Prior to Admission medications   Medication Sig Start Date End Date Taking? Authorizing Provider  Coenzyme Q10 (COQ10) 200 MG CAPS Take 1 capsule by mouth daily.   Yes [provider]  loratadine (CLARITIN) 10 MG tablet Take 10 mg by mouth daily.   Yes [provider]  Magnesium 200 MG TABS Take 1 tablet by mouth daily.   Yes [provider]  omega-3 acid ethyl esters (LOVAZA) 1 g capsule Take  1 g by mouth 2 (two) times daily.   Yes [provider]  omeprazole (PRILOSEC) 20 MG capsule Take 20 mg by mouth daily.   Yes [provider]  pyridOXINE (VITAMIN B-6) 100 MG tablet Take 100 mg by mouth daily.   Yes [provider]  Tetrahydrozoline HCl (VISINE OP) Place 1 drop into both eyes daily as needed (dry eyes).   Yes [provider]  thiamine (VITAMIN B-1) 100 MG tablet Take 100 mg by mouth daily.   Yes [provider]  Turmeric (QC TUMERIC COMPLEX PO) Take 1 tablet by mouth daily.   Yes [provider]  diltiazem (CARDIZEM CD) 120 MG 24 hr capsule Take 1 capsule (120 mg total) by mouth daily. Patient not taking: Reported on 09/29/2020 09/06/20 12/05/20  Barb Merino, MD    Inpatient Medications: Scheduled Meds:  atorvastatin  40 mg Oral Daily   melatonin  3 mg Oral QHS   Continuous  Infusions:  PRN Meds:   Allergies:    Allergies  Allergen Reactions   Macrobid [Nitrofurantoin] Other (See Comments)    Flu like symptoms    Social History:   Social History   Socioeconomic History   Marital status: Married    Spouse name: Not on file   Number of children: Not on file   Years of education: Not on file   Highest education level: Not on file  Occupational History   Not on file  Tobacco Use   Smoking status: Former    Packs/day: 0.20    Years: 15.00    Pack years: 3.00    Types: Cigarettes    Quit date: 07/18/1996    Years since quitting: 24.9   Smokeless tobacco: Never  Substance and Sexual Activity   Alcohol use: Yes    Alcohol/week: 21.0 standard drinks    Types: 21 Glasses of wine per week    Comment: 3 glasses of wine daily   Drug use: Yes    Comment: Phentermine, Tramadol   Sexual activity: Yes    Birth control/protection: Surgical  Other Topics Concern   Not on file  Social History Narrative   Not on file   Social Determinants of Health   Financial Resource Strain: Not on file  Food Insecurity: Not on file  Transportation Needs: Not on file  Physical Activity: Not on file  Stress: Not on file  Social Connections: Not on file  Intimate Partner Violence: Not on file    Family History:   Family History  Problem Relation Age of Onset   Hyperlipidemia Father    Asthma Father    Other Mother    Heart disease Mother    Stomach cancer Paternal Grandfather    Colon cancer Neg Hx    Esophageal cancer Neg Hx    Rectal cancer Neg Hx    Breast cancer Neg Hx      ROS:  Please see the history of present illness.  All other ROS reviewed and negative.     Physical Exam/Data:   Vitals:   06/20/21 2028 06/21/21 0055 06/21/21 0419 06/21/21 0741  BP: 119/71 121/81 118/70 118/60  Pulse: 71 64 65 66  Resp: 16 14 16 20   Temp: 97.7 F (36.5 C) 98.1 F (36.7 C) 98 F (36.7 C) 98.1 F (36.7 C)  TempSrc: Oral Oral Oral Oral  SpO2: 100%  100% 98% 100%  Weight:      Height:       No intake or  output data in the 24 hours ending 06/21/21 1417 Last 3 Weights 06/20/2021 09/29/2020 09/05/2020  Weight (lbs) 160 lb 155 lb 9.6 oz 149 lb 4 oz  Weight (kg) 72.576 kg 70.58 kg 67.7 kg     Body mass index is 24.33 kg/m.  General:  Well nourished, well developed, in no acute distress HEENT: normal Neck: no JVD Vascular: No carotid bruits; Distal pulses 2+ bilaterally Cardiac:  normal S1, S2; RRR; no murmur  Lungs:  clear to auscultation bilaterally, no wheezing, rhonchi or rales  Abd: soft, nontender, no hepatomegaly  Ext: no edema Musculoskeletal:  No deformities, BUE and BLE strength normal and equal Skin: warm and dry  Neuro:  CNs 2-12 intact, no focal abnormalities noted Psych:  Normal affect   EKG:  The EKG was personally reviewed and demonstrates:  12/04 ECG is atrial fib, RVR, HR 104, no acute ischemic changes Telemetry:  Telemetry was personally reviewed and demonstrates: Sinus rhythm since transferred to the floor  Relevant CV Studies:  ECHO: 06/21/2021  1. Left ventricular ejection fraction, by estimation, is 55 to 60%. The  left ventricle has normal function. The left ventricle has no regional  wall motion abnormalities. Left ventricular diastolic parameters were  normal.   2. Right ventricular systolic function is normal. The right ventricular  size is normal. Tricuspid regurgitation signal is inadequate for assessing PA pressure.   3. The mitral valve is normal in structure. Mild mitral valve  regurgitation. No evidence of mitral stenosis.   4. The aortic valve is tricuspid. Aortic valve regurgitation is not  visualized. Aortic valve sclerosis is present, with no evidence of aortic valve stenosis.   5. The inferior vena cava is normal in size with greater than 50%  respiratory variability, suggesting right atrial pressure of 3 mmHg.   6. Agitated saline contrast bubble study was negative, with no evidence of any  interatrial shunt. Technically difficult bubble study, but no shunting seen.  7. Both atria normal in size  Laboratory Data:  High Sensitivity Troponin:  No results for input(s): TROPONINIHS in the last 720 hours.   Chemistry Recent Labs  Lab 06/20/21 1047 06/20/21 1054 06/21/21 0049  NA 139 141 136  K 3.7 4.0 4.8  CL 105 107 104  CO2 22  --  27  GLUCOSE 90 86 94  BUN 16 18 21*  CREATININE 0.81 0.80 0.82  CALCIUM 9.9  --  9.0  MG  --   --  2.1  GFRNONAA >60  --  >60  ANIONGAP 12  --  5    Recent Labs  Lab 06/20/21 1047  PROT 7.6  ALBUMIN 4.2  AST 29  ALT 21  ALKPHOS 47  BILITOT 0.7   Lipids  Recent Labs  Lab 06/21/21 0818  CHOL 178  TRIG 68  HDL 69  LDLCALC 95  CHOLHDL 2.6    Hematology Recent Labs  Lab 06/20/21 1047 06/20/21 1054  WBC 8.6  --   RBC 4.66  --   HGB 14.8 15.0  HCT 44.5 44.0  MCV 95.5  --   MCH 31.8  --   MCHC 33.3  --   RDW 12.7  --   PLT 276  --    Thyroid  Recent Labs  Lab 06/21/21 0818  TSH 2.557    BNPNo results for input(s): BNP, PROBNP in the last 168 hours.  DDimer No results for input(s): DDIMER in the last 168 hours.   Radiology/Studies:  MR BRAIN WO  CONTRAST  Result Date: 06/20/2021 CLINICAL DATA:  TIA EXAM: MRI HEAD WITHOUT CONTRAST TECHNIQUE: Multiplanar, multiecho pulse sequences of the brain and surrounding structures were obtained without intravenous contrast. COMPARISON:  None. FINDINGS: Brain: There is no acute infarction or intracranial hemorrhage. There is no intracranial mass, mass effect, or edema. There is no hydrocephalus or extra-axial fluid collection. Ventricles and sulci are normal in size and configuration. Vascular: Major vessel flow voids at the skull base are preserved. Skull and upper cervical spine: Normal marrow signal is preserved. Sinuses/Orbits: Paranasal sinuses are aerated. Orbits are unremarkable. Other: Sella is unremarkable.  Mastoid air cells are clear. IMPRESSION: No acute infarction,  hemorrhage, or mass. Electronically Signed   By: Macy Mis M.D.   On: 06/20/2021 16:40   ECHOCARDIOGRAM COMPLETE BUBBLE STUDY  Result Date: 06/21/2021    ECHOCARDIOGRAM REPORT   Patient Name:   JANANN BOEVE Date of Exam: 06/21/2021 Medical Rec #:  [id number]        Height:       68.0 in Accession #:    [id number]       Weight:       160.0 lb Date of Birth:  [birth year]        BSA:          1.859 m Patient Age:    31 years         BP:           121/81 mmHg Patient Gender: F                HR:           74 bpm. Exam Location:  Inpatient Procedure: 2D Echo, Cardiac Doppler and Color Doppler Indications:    Afib, bubble  History:        Patient has prior history of Echocardiogram examinations, most                 recent 09/25/2020. Arrythmias:Atrial Fibrillation.  Sonographer:    Glo Herring Referring Phys: [id number] [place]  1. Left ventricular ejection fraction, by estimation, is 55 to 60%. The left ventricle has normal function. The left ventricle has no regional wall motion abnormalities. Left ventricular diastolic parameters were normal.  2. Right ventricular systolic function is normal. The right ventricular size is normal. Tricuspid regurgitation signal is inadequate for assessing PA pressure.  3. The mitral valve is normal in structure. Mild mitral valve regurgitation. No evidence of mitral stenosis.  4. The aortic valve is tricuspid. Aortic valve regurgitation is not visualized. Aortic valve sclerosis is present, with no evidence of aortic valve stenosis.  5. The inferior vena cava is normal in size with greater than 50% respiratory variability, suggesting right atrial pressure of 3 mmHg.  6. Agitated saline contrast bubble study was negative, with no evidence of any interatrial shunt. Technically difficult bubble study, but no shunting seen. FINDINGS  Left Ventricle: Left ventricular ejection fraction, by estimation, is 55 to 60%. The left ventricle has normal function. The left  ventricle has no regional wall motion abnormalities. The left ventricular internal cavity size was normal in size. There is  no left ventricular hypertrophy. Left ventricular diastolic parameters were normal. Right Ventricle: The right ventricular size is normal. No increase in right ventricular wall thickness. Right ventricular systolic function is normal. Tricuspid regurgitation signal is inadequate for assessing PA pressure. Left Atrium: Left atrial size was normal in size. Right Atrium: Right atrial size was normal in size. Pericardium:  Trivial pericardial effusion is present. Mitral Valve: The mitral valve is normal in structure. Mild mitral valve regurgitation. No evidence of mitral valve stenosis. Tricuspid Valve: The tricuspid valve is normal in structure. Tricuspid valve regurgitation is trivial. Aortic Valve: The aortic valve is tricuspid. Aortic valve regurgitation is not visualized. Aortic valve sclerosis is present, with no evidence of aortic valve stenosis. Aortic valve mean gradient measures 4.0 mmHg. Aortic valve peak gradient measures 6.6  mmHg. Aortic valve area, by VTI measures 1.52 cm. Pulmonic Valve: The pulmonic valve was not well visualized. Pulmonic valve regurgitation is not visualized. Aorta: The aortic root and ascending aorta are structurally normal, with no evidence of dilitation. Venous: The inferior vena cava is normal in size with greater than 50% respiratory variability, suggesting right atrial pressure of 3 mmHg. IAS/Shunts: The interatrial septum was not well visualized. Agitated saline contrast bubble study was negative, with no evidence of any interatrial shunt.  LEFT VENTRICLE PLAX 2D LVIDd:         4.20 cm     Diastology LVIDs:         2.70 cm     LV e' medial:    9.46 cm/s LV PW:         0.90 cm     LV E/e' medial:  4.5 LV IVS:        0.90 cm     LV e' lateral:   11.90 cm/s LVOT diam:     1.90 cm     LV E/e' lateral: 3.6 LV SV:         49 LV SV Index:   26 LVOT Area:     2.84  cm  LV Volumes (MOD) LV vol d, MOD A2C: 65.3 ml LV vol d, MOD A4C: 67.8 ml LV vol s, MOD A2C: 28.5 ml LV vol s, MOD A4C: 26.3 ml LV SV MOD A2C:     36.8 ml LV SV MOD A4C:     67.8 ml LV SV MOD BP:      39.4 ml RIGHT VENTRICLE          IVC RV Basal diam:  3.00 cm  IVC diam: 2.00 cm LEFT ATRIUM             Index        RIGHT ATRIUM           Index LA diam:        3.40 cm 1.83 cm/m   RA Area:     13.40 cm LA Vol (A2C):   39.2 ml 21.09 ml/m  RA Volume:   31.30 ml  16.84 ml/m LA Vol (A4C):   47.5 ml 25.55 ml/m LA Biplane Vol: 47.0 ml 25.28 ml/m  AORTIC VALVE                    PULMONIC VALVE AV Area (Vmax):    1.61 cm     PV Vmax:       0.80 m/s AV Area (Vmean):   1.51 cm     PV Peak grad:  2.6 mmHg AV Area (VTI):     1.52 cm AV Vmax:           128.00 cm/s AV Vmean:          92.700 cm/s AV VTI:            0.321 m AV Peak Grad:      6.6 mmHg AV Mean Grad:  4.0 mmHg LVOT Vmax:         72.50 cm/s LVOT Vmean:        49.500 cm/s LVOT VTI:          0.172 m LVOT/AV VTI ratio: 0.54  AORTA Ao Root diam: 2.50 cm Ao Asc diam:  2.40 cm MITRAL VALVE MV Area (PHT): 3.74 cm    SHUNTS MV Decel Time: 203 msec    Systemic VTI:  0.17 m MV E velocity: 42.92 cm/s  Systemic Diam: 1.90 cm MV A velocity: 50.60 cm/s MV E/A ratio:  0.85 Oswaldo Milian MD Electronically signed by Oswaldo Milian MD Signature Date/Time: 06/21/2021/10:52:05 AM    Final    CT HEAD CODE STROKE WO CONTRAST  Result Date: 06/20/2021 CLINICAL DATA:  Code stroke.  55 year old female. EXAM: CT HEAD WITHOUT CONTRAST TECHNIQUE: Contiguous axial images were obtained from the base of the skull through the vertex without intravenous contrast. COMPARISON:  None. FINDINGS: Brain: Cerebral volume is within normal limits for age. No midline shift, ventriculomegaly, mass effect, evidence of mass lesion, intracranial hemorrhage or evidence of cortically based acute infarction. Gray-white matter differentiation is within normal limits throughout the brain.  No encephalomalacia identified. Vascular: No suspicious intracranial vascular hyperdensity. Skull: Negative. Sinuses/Orbits: Visualized paranasal sinuses and mastoids are clear. Other: Visualized orbit soft tissues are within normal limits. Visualized scalp soft tissues are within normal limits. ASPECTS [facility] Stroke Program Early CT Score) Total score (0-10 with 10 being normal): 10 IMPRESSION: 1. Normal for age non contrast CT appearance of the brain. ASPECTS 10. 2. These results were communicated to Dr. Cheral Marker at 11:05 am on 06/20/2021 by text page via the [facility] messaging system. Electronically Signed   By: Genevie Ann M.D.   On: 06/20/2021 11:05     Assessment and Plan:   PAF: -It is not clear how long she was in it, she may have been in it for several hours before she started having stroke symptoms. - She does occasionally have palpitations, but has not checked her heart rate to see if she is in A. fib or worn the monitor. - She agrees to wear the monitor now, I have messaged Tonye Pearson to see if she can just go home and put it on - If not, perhaps I can write another order that can be linked to that monitor. - Her EF was normal with no wall motion abnormalities and both atria normal in size on echo this admission -Discuss with MD patient going home and wearing the monitor, then following up with either Dr. Gardiner Rhyme or the A Fib clinic -Because of the second TIA, I feel that her CHA2DS2-VASc should be 3 (female and TIA x2) instead of 1 - Therefore, anticoagulation is indicated  2.  TIA -CT and MR showed no acute blockage, but symptoms were noticeable and prolonged - Advised her she dodged a bullet  Risk Assessment/Risk Scores:        CHA2DS2-VASc Score = 3   This indicates a 3.2% annual risk of stroke. The patient's score is based upon: CHF History: 0 HTN History: 0 Diabetes History: 0 Stroke History: 2 Vascular Disease History: 0 Age Score: 0 Gender Score: 1   For questions  or updates, please contact [place] Please consult [email or web address] for contact info under    Signed, Rosaria Ferries, PA-C  06/21/2021 2:17 PM

## 2021-06-21 NOTE — Progress Notes (Signed)
Discussed CTA head/neck with patient and her husband.  Advised that we evaluate neck vasculature for other causes of TIA/strokelike symptoms but patient is not interested at this time.  They have opted to seek this scan outpatient and discussed with cardiology.  I also advised them of normal echo and discussed medications for discharge.  At time of leaving room, patient and her husband did not have any further questions.

## 2021-06-21 NOTE — Hospital Course (Addendum)
Kim Irwin is a 55 y.o.female with a history of A. fib with RVR not on anticoagulation who was admitted to the [facility] Teaching [facility] at [facility] for right-sided facial and arm numbness. Her hospital course is detailed below:  Acute right-sided weakness  TIA Patient presented to the ED with right-sided arm and facial numbness, confusion, dizziness.  Symptoms dissipated by the time of admission.  Stroke work-up with CT/MR negative.  Blood work overall unremarkable.  Echo noted LVEF 55 to 60% without regional wall motion abnormalities.  Cardiology saw patient and advised outpatient follow-up. Upon discharge, patient was not interested of CTA head/neck and deferred to pursue outpatient. Patient was discharged on atorvastatin 40 mg and Eliquis 5mg  BID.  Paroxysmal A. fib with RVR Not actively taking patient was not actively taking anticoagulation or rate control medications as previously recommended by cardiology prior to hospitalization.  Given the most recent event, cardiology advised Eliquis 5 mg twice daily.  Patient was amenable upon discharge and medication sent to pharmacy.  PCP Follow-up Recommendations: 1.  Advise discontinuation of over-the-counter supplements.

## 2021-06-21 NOTE — Evaluation (Signed)
Occupational Therapy Evaluation and Discharge Patient Details Name: Kim Irwin MRN: [id number] DOB: November 11, 1965 Today's Date: 06/21/2021   History of Present Illness Pt presented to the ED with right sided arm and facial numbness, confusion, and dizziness. She has a history of Afib with RVR and is not on anticoagulation, anxiety, slight hearing loss (tinnitus)   Clinical Impression   This 55 yo female admitted with above presents to acute OT back to her independent baseline. No further OT needs and no PT needs identified, we will sign off.      Recommendations for follow up therapy are one component of a multi-disciplinary discharge planning process, led by the attending physician.  Recommendations may be updated based on patient status, additional functional criteria and insurance authorization.   Follow Up Recommendations  No OT follow up    Assistance Recommended at Discharge None  Functional Status Assessment  Patient has not had a recent decline in their functional status        Precautions / Restrictions Precautions Precautions: None Restrictions Weight Bearing Restrictions: No      Mobility Bed Mobility Overal bed mobility: Independent                  Transfers Overall transfer level: Independent                        Balance Overall balance assessment: No apparent balance deficits (not formally assessed)                                         ADL either performed or assessed with clinical judgement   ADL Overall ADL's : Independent                                             Vision Baseline Vision/History: 0 No visual deficits Ability to See in Adequate Light: 0 Adequate Patient Visual Report: No change from baseline Vision Assessment?: Yes Eye Alignment: Within Functional Limits Ocular Range of Motion: Within Functional Limits Alignment/Gaze Preference: Within Defined Limits Tracking/Visual  Pursuits: Able to track stimulus in all quads without difficulty Saccades: Within functional limits            Pertinent Vitals/Pain Pain Assessment: No/denies pain     Hand Dominance Right   Extremity/Trunk Assessment Upper Extremity Assessment Upper Extremity Assessment: Overall WFL for tasks assessed   Lower Extremity Assessment Lower Extremity Assessment: Overall WFL for tasks assessed       Communication Communication Communication: No difficulties   Cognition Arousal/Alertness: Awake/alert Behavior During Therapy: WFL for tasks assessed/performed Overall Cognitive Status: Within Functional Limits for tasks assessed                                       General Comments  Can bend over and pick up item from floor without issue            Home Living Family/patient expects to be discharged to:: Private residence Living Arrangements: Spouse/significant other Available Help at Discharge: Family Type of Home: House Home Access: Stairs to enter Technical brewer of Steps: 3 Entrance Stairs-Rails: Right Home Layout: One level     Bathroom  Shower/Tub: Tub/shower unit         Home Equipment: None          Prior Functioning/Environment Prior Level of Function : Independent/Modified Independent;Working/employed;Driving                                 OT Goals(Current goals can be found in the care plan section) Acute Rehab OT Goals Patient Stated Goal: to keep A-fib under control                AM-PAC OT "6 Clicks" Daily Activity     Outcome Measure Help from another person eating meals?: None Help from another person taking care of personal grooming?: None Help from another person toileting, which includes using toliet, bedpan, or urinal?: None Help from another person bathing (including washing, rinsing, drying)?: None Help from another person to put on and taking off regular upper body clothing?: None Help from  another person to put on and taking off regular lower body clothing?: None 6 Click Score: 24   End of Session    Activity Tolerance: Patient tolerated treatment well Patient left: in bed;with call bell/phone within reach  OT Visit Diagnosis: Muscle weakness (generalized) (M62.81) (has returned to baseline)                Time: 1000-1010 OT Time Calculation (min): 10 min Charges:  OT General Charges $OT Visit: 1 Visit OT Evaluation $OT Eval Low Complexity: 1 Low  Kim Irwin, OTR/L Acute [employer] Pager [phone] Office [phone]    Kim Irwin 06/21/2021, 12:46 PM

## 2021-06-23 ENCOUNTER — Ambulatory Visit: Payer: BC Managed Care – PPO

## 2021-06-23 ENCOUNTER — Telehealth: Payer: Self-pay | Admitting: Cardiology

## 2021-06-23 ENCOUNTER — Ambulatory Visit (INDEPENDENT_AMBULATORY_CARE_PROVIDER_SITE_OTHER): Payer: BC Managed Care – PPO

## 2021-06-23 DIAGNOSIS — I480 Paroxysmal atrial fibrillation: Secondary | ICD-10-CM

## 2021-06-23 NOTE — Telephone Encounter (Signed)
Enrolled patient for 14 day Zio XT monitor to replace the enrollment from March

## 2021-06-23 NOTE — Telephone Encounter (Signed)
Spoke to [employer]. Since its been 9 months since original monitor was ordered they are unable to send a replacement. I will have a new order put in by the nurse and then will enroll patient for a second monitor to be mailed.

## 2021-06-23 NOTE — Telephone Encounter (Signed)
New order placed

## 2021-06-23 NOTE — Telephone Encounter (Signed)
  Loraine with [employer], she said, pt called them and advised them that pt just wore the heart monitor she received last March, Loraine said that heart monitor expired in August and wont get accurate result so the pt took it off and will be sending it back to [place]. She said Dr. Gardiner Rhyme needs to order a new heart monitor for the pt. She gave Ticket# [id number]

## 2021-06-23 NOTE — Progress Notes (Unsigned)
Enrolled patient for a 14 day Zio XT  monitor to be mailed to patients home  °

## 2021-06-23 NOTE — Telephone Encounter (Signed)
Routed to [facility] to assist with placing order for new monitor to be shipped to patient

## 2021-06-27 DIAGNOSIS — I480 Paroxysmal atrial fibrillation: Secondary | ICD-10-CM

## 2021-06-29 DIAGNOSIS — I480 Paroxysmal atrial fibrillation: Secondary | ICD-10-CM | POA: Diagnosis not present

## 2021-07-17 DIAGNOSIS — I480 Paroxysmal atrial fibrillation: Secondary | ICD-10-CM | POA: Diagnosis not present

## 2021-08-02 ENCOUNTER — Telehealth: Payer: Self-pay | Admitting: *Deleted

## 2021-08-02 MED ORDER — DILTIAZEM HCL ER COATED BEADS 120 MG PO CP24: 120.0000 mg | ORAL_CAPSULE | Freq: Every day | ORAL | 3 refills | Status: DC

## 2021-08-02 NOTE — Telephone Encounter (Signed)
-----   Message from Donato Heinz, MD sent at 08/01/2021 10:32 PM EST ----- 11% atrial fibrillation burden.  Rates intermittently elevated, recommend starting diltiazem 120 mg daily.  Would continue taking Eliquis.

## 2021-08-02 NOTE — Telephone Encounter (Signed)
Spoke with pt, aware of the results. She has some diltiazem from her hospitalization.

## 2021-08-09 NOTE — Progress Notes (Signed)
Cardiology Office Note:    Date:  08/17/2021   ID:  Kim Irwin, DOB May 05, 1966, MRN [id number]  PCP:  Everardo Beals, NP  Cardiologist:  Donato Heinz, MD  Electrophysiologist:  None   Referring MD: Everardo Beals, NP   Chief Complaint: hospital follow-up of atrial fibrillation  History of Present Illness:    Kim Irwin is a 56 y.o. female with a history of paroxysmal atrial fibrillation not on anticoagulation, hyperlipidemia, GERD, and anxiety who is followed by Dr. Gardiner Rhyme and presents today for  hospital follow-up of atrial fibrillation.  Patient was admitted in 08/2020 for new onset atrial fibrillation with RVR after taking Phentermine and Tramadol. She was found to be positive for COVID-19 positive. DCCV was attempted twice in the ED but was unsuccessful and she was started on IV Amiodarone and Diltiazem and converted back to sinus rhythm. CHA2DS2-VASc was 1 for gender alone and patient declined anticoagulation. She was referred to Cardiology as an outpatient and was seen by Dr. Gardiner Rhyme in 09/2020 at which time she was doing well and did not feel like she had had any further atrial fibrillation. She had stopped taking the Diltiazem. Echo prior to this visit showed LVEF of 60-65% with normal wall motion and normal diastolic function with no significant valvular disease. Zio monitor was ordered to look for recurrence but this was never done.  Patient was recently admitted in 06/2021 for a TIA after presenting with right-sided facial and arm numbness. She was noted to be back in atrial fibrillation on presentation and was started on Eliquis. Converted back to sinus rhythm during admission. Echo showed LVEF of 55-60% with normal wall motion, and mild MR. Bubble study was negative for interatrial shunt. Outpatient monitor was reorder at discharge to assess atrial fibrillation burden. Monitor showed an 11% atrial fibrillation burden with longest episode lasting 19 hours  and an average heart rate of 97 bpm while in atrial fibrillation. She was restarted on Diltiazem 120mg  daily.  Patient presents today for follow-up. Here alone. Patient is in rate controlled atrial fibrillation today with rates in the 90s.  She reports intermittent palpitations almost on a daily basis but these are not overly bothersome to her. She stopped the Eliquis and Lipitor which were started in the hospital because she like felt this was giving her headache.  She has been off these medications for over 1 week and still has a headache so I told her I do not think her headache was a side effect of the medications. She also states she felt bad and "not right" on the Diltiazem so she stopped this as well.  She has some mild lightheadedness with her headache but no other stroke-like symptoms.  No recurrent facial/arm numbness, blurred vision, facial droop, slurred speech, etc. She denies any other cardiac symptoms-no chest pain, shortness of breath, orthopnea, PND, lower extremity edema, syncope.  No abnormal bleeding in urine or stools.  We had a long discussion about the importance of anticoagulation and its role in helping prevent recurrent TIA/stroke. She has agreed to restart Eliquis but does not want to restart any rate control medication or Lipitor.  Past Medical History:  Diagnosis Date   Allergy    macrobid   Anxiety    Arthritis    hands   GERD (gastroesophageal reflux disease)    Hearing loss    slight, no hearing aids   Migraine    otc med prn   Paroxysmal A-fib ([place])  Pneumonia    hx - resolved   SVD (spontaneous vaginal delivery)    x 1   Urinary tract infection    Hx - resolved    Past Surgical History:  Procedure Laterality Date   COLONOSCOPY  09/2003   Normal - [place]   endometrial ablation     TUBAL LIGATION     WRIST SURGERY Right     Current Medications: Current Meds  Medication Sig   Coenzyme Q10 (COQ10) 200 MG CAPS Take 1 capsule by mouth daily.    famotidine (PEPCID) 20 MG tablet Take 20 mg by mouth as needed for heartburn or indigestion.   loratadine (CLARITIN) 10 MG tablet Take 10 mg by mouth daily.   Tetrahydrozoline HCl (VISINE OP) Place 1 drop into both eyes daily as needed (dry eyes).     Allergies:   Macrobid [nitrofurantoin]   Social History   Socioeconomic History   Marital status: Married    Spouse name: Not on file   Number of children: Not on file   Years of education: Not on file   Highest education level: Not on file  Occupational History   Not on file  Tobacco Use   Smoking status: Former    Packs/day: 0.20    Years: 15.00    Pack years: 3.00    Types: Cigarettes    Quit date: 07/18/1996    Years since quitting: 25.0   Smokeless tobacco: Never  Substance and Sexual Activity   Alcohol use: Yes    Alcohol/week: 21.0 standard drinks    Types: 21 Glasses of wine per week    Comment: 3 glasses of wine daily   Drug use: Yes    Comment: Phentermine, Tramadol   Sexual activity: Yes    Birth control/protection: Surgical  Other Topics Concern   Not on file  Social History Narrative   Not on file   Social Determinants of Health   Financial Resource Strain: Not on file  Food Insecurity: Not on file  Transportation Needs: Not on file  Physical Activity: Not on file  Stress: Not on file  Social Connections: Not on file     Family History: The patient's family history includes Asthma in her father; Heart disease in her mother; Hyperlipidemia in her father; Other in her mother; Stomach cancer in her paternal grandfather. There is no history of Colon cancer, Esophageal cancer, Rectal cancer, or Breast cancer.  ROS:   Please see the history of present illness.     EKGs/Labs/Other Studies Reviewed:    The following studies were reviewed today:  Echocardiogram with Bubble Study 06/21/2021: Impressions:  1. Left ventricular ejection fraction, by estimation, is 55 to 60%. The  left ventricle has normal  function. The left ventricle has no regional  wall motion abnormalities. Left ventricular diastolic parameters were  normal.   2. Right ventricular systolic function is normal. The right ventricular  size is normal. Tricuspid regurgitation signal is inadequate for assessing  PA pressure.   3. The mitral valve is normal in structure. Mild mitral valve  regurgitation. No evidence of mitral stenosis.   4. The aortic valve is tricuspid. Aortic valve regurgitation is not  visualized. Aortic valve sclerosis is present, with no evidence of aortic  valve stenosis.   5. The inferior vena cava is normal in size with greater than 50%  respiratory variability, suggesting right atrial pressure of 3 mmHg.   6. Agitated saline contrast bubble study was negative, with no evidence  of any interatrial shunt. Technically difficult bubble study, but no  shunting seen. _______________  2 Week Zio Monitor 06/2021: Patch Wear Time:  14 days and 0 hours (2022-12-11T06:26:11-498 to 2022-12-25T06:26:03-499)   Patient had a min HR of 46 bpm, max HR of 203 bpm, and avg HR of 72 bpm. Predominant underlying rhythm was Sinus Rhythm. 1 run of Supraventricular Tachycardia occurred lasting 4 beats with a max rate of 124 bpm (avg 106 bpm). Atrial Fibrillation occurred  (11% burden), ranging from 53-203 bpm (avg of 97 bpm), the longest lasting 19 hours 16 mins with an avg rate of 94 bpm. Atrial Fibrillation was detected within +/- 45 seconds of symptomatic patient event(s). Isolated SVEs were rare (<1.0%), SVE Couplets  were rare (<1.0%), and SVE Triplets were rare (<1.0%). Isolated VEs were rare (<1.0%), VE Couplets were rare (<1.0%), and no VE Triplets were present.  15 patient triggered events, corresponding to atrial fibrillation in sinus rhythm  PACs  Conclusion: 11% atrial fibrillation burden, longest episode lasting 19 hours. Average heart rate in A. fib was 97 bpm  EKG:  EKG ordered today. EKG personally reviewed  and shows atrial fibrillation, rate 93 bpm, with non-specific ST/T changes. Normal axis. QTc 457 ms.  Recent Labs: 06/20/2021: ALT 21; Hemoglobin 15.0; Platelets 276 06/21/2021: BUN 21; Creatinine, Ser 0.82; Magnesium 2.1; Potassium 4.8; Sodium 136; TSH 2.557  Recent Lipid Panel    Component Value Date/Time   CHOL 178 06/21/2021 0818   TRIG 68 06/21/2021 0818   HDL 69 06/21/2021 0818   CHOLHDL 2.6 06/21/2021 0818   VLDL 14 06/21/2021 0818   LDLCALC 95 06/21/2021 0818    Physical Exam:    Vital Signs: BP 128/72    Pulse 93    Ht 5\' 8"  (1.727 m)    Wt 169 lb (76.7 kg)    SpO2 98%    BMI 25.70 kg/m     Wt Readings from Last 3 Encounters:  08/17/21 169 lb (76.7 kg)  06/20/21 160 lb (72.6 kg)  09/29/20 155 lb 9.6 oz (70.6 kg)     General: 56 y.o. Caucasian female in no acute distress. HEENT: Normocephalic and atraumatic. Sclera clear.  Neck: Supple. No carotid bruits. No JVD. Heart: Irregularly irregular rhythm with normal rate. Distinct S1 and S2. No murmurs, gallops, or rubs. Radial pulses 2+ and equal bilaterally. Lungs: No increased work of breathing. Clear to ausculation bilaterally. No wheezes, rhonchi, or rales.  Abdomen: Soft, non-distended, and non-tender to palpation.  MSK: Normal strength and tone for age.  Extremities: No lower extremity edema.    Skin: Warm and dry. Neuro: Alert and oriented x3. No focal deficits. Psych: Normal affect. Responds appropriately.  Assessment:    1. Paroxysmal atrial fibrillation (HCC)   2. Hyperlipidemia LDL goal <70   3. Nonintractable headache, unspecified chronicity pattern, unspecified headache type     Plan:    Paroxysmal Atrial Fibrillation Initial diagnosed in 08/2020 in the setting of COVID-19. However, she was admitted with TIA in 06/2021 and found to be back in atrial fibrillation. Recent monitor showed 11% atrial fibrillation burden with longest episode lasting 19 hours. She was started on Diltiazem.  - She is in rate  controlled atrial fibrillation today. She does have palpitations with this but not overly bothersome. She states she has intermittent palpitations almost on a daily basis. - She stopped taking Diltiazem and Eliquis as described in HPI. She does not want to take many medications and states she is "not going  to do it." I am okay with her remaining off a rate control medication for now given rate is normal (although high end of normal) if that means she will restart Eliquis. We had a long discussion on the importance of anticoagulation and its role in reducing risk of recurrent TIA/stroke. She has agreed to restart Eliquis 5mg  twice daily. Patient Assistant forms were provided.  Hyperlipidemia Lipid panel during recent admission: Total Cholesterol 178, Triglycerides 68, HDL 69, LDL 95. LDL goal <70 given TIA. - She stopped taking Lipitor 40mg  daily which was prescribed during admission because she felt like it was given her a headache. Headache has persistent after being off the medicine for 1 week so I do not think this was the cause. However, patient does not want to restart Lipitor and wants to try to get LDL to goal with lifestyle modifications first. Will recheck lipid panel in 3 months.  Headache Patient describes a pressure like headache for a couple of weeks now. No history of migraines. No other stroke-like symptoms.  - I do not think this is from her Eliquis or Lipitor given symptoms of persistent after stopping medications a week ago. - Recommended following up with PCP.  Disposition: Follow up in 3 months.   Medication Adjustments/Labs and Tests Ordered: Current medicines are reviewed at length with the patient today.  Concerns regarding medicines are outlined above.  Orders Placed This Encounter  Procedures   Lipid panel   EKG 12-Lead   Meds ordered this encounter  Medications   apixaban (ELIQUIS) 5 MG TABS tablet    Sig: Take 1 tablet (5 mg total) by mouth 2 (two) times daily.     Dispense:  180 tablet    Refill:  3    Patient Instructions  Medication Instructions:  RESTART Eliquis 5 mg 2 times a day  *If you need a refill on your cardiac medications before your next appointment, please call your pharmacy*  Lab Work: Your physician recommends that you return for lab work in 3 MONTHS:  Fasting Lipid Panel-DO NOT EAT OR DRINK PAST MIDNIGHT. OKAY TO HAVE WATER TO DRINK. If you have labs (blood work) drawn today and your tests are completely normal, you will receive your results only by: [place] (if you have MyChart) OR A paper copy in the mail If you have any lab test that is abnormal or we need to change your treatment, we will call you to review the results.  Testing/Procedures: NONE ordered at this time of appointment   Follow-Up: At [facility], you and your health needs are our priority.  As part of our continuing mission to provide you with exceptional heart care, we have created designated Provider Care Teams.  These Care Teams include your primary Cardiologist (physician) and Advanced Practice Providers (APPs -  Physician Assistants and Nurse Practitioners) who all work together to provide you with the care you need, when you need it.  Your next appointment:   3 month(s)  The format for your next appointment:   In Person  Provider:   Donato Heinz, MD    Other Instructions     Signed, Darreld Mclean, PA-C  08/17/2021 9:59 AM    [place]

## 2021-08-17 ENCOUNTER — Encounter: Payer: Self-pay | Admitting: Student

## 2021-08-17 ENCOUNTER — Other Ambulatory Visit: Payer: Self-pay

## 2021-08-17 ENCOUNTER — Ambulatory Visit: Payer: BC Managed Care – PPO | Admitting: Student

## 2021-08-17 ENCOUNTER — Telehealth: Payer: Self-pay

## 2021-08-17 VITALS — BP 128/72 | HR 93 | Ht 68.0 in | Wt 169.0 lb

## 2021-08-17 DIAGNOSIS — R519 Headache, unspecified: Secondary | ICD-10-CM

## 2021-08-17 DIAGNOSIS — E785 Hyperlipidemia, unspecified: Secondary | ICD-10-CM

## 2021-08-17 DIAGNOSIS — I480 Paroxysmal atrial fibrillation: Secondary | ICD-10-CM

## 2021-08-17 MED ORDER — APIXABAN 5 MG PO TABS: 5.0000 mg | ORAL_TABLET | Freq: Two times a day (BID) | ORAL | 3 refills | Status: DC

## 2021-08-17 NOTE — Telephone Encounter (Signed)
Patient was given application to the foundation for her Eliquis. Informed patient to fill out the paper work along with the needed financial information and that she can return it to the office to faxed or she an mail and/or fax it herself. Stated that if she mails and/or fax herself to call and let me know so that I can fax the provider portion. Patient verbalized understanding and all (if any) questions were answered.

## 2021-08-17 NOTE — Patient Instructions (Addendum)
Medication Instructions:  RESTART Eliquis 5 mg 2 times a day  *If you need a refill on your cardiac medications before your next appointment, please call your pharmacy*  Lab Work: Your physician recommends that you return for lab work in 3 MONTHS:  Fasting Lipid Panel-DO NOT EAT OR DRINK PAST MIDNIGHT. OKAY TO HAVE WATER TO DRINK. If you have labs (blood work) drawn today and your tests are completely normal, you will receive your results only by: [place] (if you have MyChart) OR A paper copy in the mail If you have any lab test that is abnormal or we need to change your treatment, we will call you to review the results.  Testing/Procedures: NONE ordered at this time of appointment   Follow-Up: At [facility], you and your health needs are our priority.  As part of our continuing mission to provide you with exceptional heart care, we have created designated Provider Care Teams.  These Care Teams include your primary Cardiologist (physician) and Advanced Practice Providers (APPs -  Physician Assistants and Nurse Practitioners) who all work together to provide you with the care you need, when you need it.  Your next appointment:   3 month(s)  The format for your next appointment:   In Person  Provider:   Donato Heinz, MD    Other Instructions

## 2021-09-16 DIAGNOSIS — M79631 Pain in right forearm: Secondary | ICD-10-CM | POA: Diagnosis not present

## 2021-10-17 DIAGNOSIS — I480 Paroxysmal atrial fibrillation: Secondary | ICD-10-CM | POA: Diagnosis not present

## 2021-10-17 DIAGNOSIS — Z7901 Long term (current) use of anticoagulants: Secondary | ICD-10-CM | POA: Diagnosis not present

## 2021-10-17 DIAGNOSIS — Z6825 Body mass index (BMI) 25.0-25.9, adult: Secondary | ICD-10-CM | POA: Diagnosis not present

## 2021-10-17 DIAGNOSIS — R29818 Other symptoms and signs involving the nervous system: Secondary | ICD-10-CM | POA: Diagnosis not present

## 2021-10-17 DIAGNOSIS — R200 Anesthesia of skin: Secondary | ICD-10-CM | POA: Diagnosis not present

## 2021-10-17 DIAGNOSIS — M549 Dorsalgia, unspecified: Secondary | ICD-10-CM | POA: Diagnosis not present

## 2021-11-14 NOTE — Progress Notes (Signed)
?Cardiology Office Note:   ? ?Date:  11/19/2021  ? ?ID:  BRIGITT MCCLISH, DOB 10-May-1966, MRN [id number] ? ?PCP:  Everardo Beals, NP  ?Cardiologist:  Donato Heinz, MD  ?Electrophysiologist:  None  ? ?Referring MD: Everardo Beals, NP  ? ?No chief complaint on file. ? ? ?History of Present Illness:   ? ?Kim Irwin is a 56 y.o. female with a hx of paroxysmal atrial fibrillation, GERD who presents for follow-up.  She was admitted to [facility] on 09/05/2020.  She presented with new onset A. fib with RVR after taking phentermine and tramadol.  She was found to be COVID-19 positive.  She failed DCCV x2 in the ED and was started on IV amiodarone and diltiazem.  She converted to normal sinus rhythm.  CHA2DS2-VASc score 1, she declined anticoagulation.  She was admitted 06/2021 with acute right-sided weakness, thought to be TIA.  She subsequently was agreeable to starting Eliquis. ? ?Echocardiogram 06/21/2021 showed normal biventricular function, no significant valvular disease, negative bubble study.  Zio patch x2 weeks on 07/20/2021 showed 11% atrial fibrillation burden with longest episode lasting 19 hours; average heart rate in A-fib 97 bpm. ? ?Since last clinic visit, she reports she is doing okay.  She had a rash with diltiazem, stopped taking.  She is intermittently following she has been in A-fib, including today.  Denies any chest pain, dyspnea, lightheadedness, syncope, lower extremity edema.   ? ? ?Wt Readings from Last 3 Encounters:  ?11/18/21 170 lb (77.1 kg)  ?08/17/21 169 lb (76.7 kg)  ?06/20/21 160 lb (72.6 kg)  ? ? ? ? ?Past Medical History:  ?Diagnosis Date  ? Allergy   ? macrobid  ? Anxiety   ? Arthritis   ? hands  ? GERD (gastroesophageal reflux disease)   ? Hearing loss   ? slight, no hearing aids  ? Migraine   ? otc med prn  ? Paroxysmal A-fib ([place])   ? Pneumonia   ? hx - resolved  ? SVD (spontaneous vaginal delivery)   ? x 1  ? Urinary tract infection   ? Hx - resolved  ? ? ?Past Surgical  History:  ?Procedure Laterality Date  ? COLONOSCOPY  09/2003  ? Normal - [facility]  ? endometrial ablation    ? TUBAL LIGATION    ? WRIST SURGERY Right   ? ? ?Current Medications: ?Current Meds  ?Medication Sig  ? Ascorbic Acid (VITAMIN C) 500 MG CAPS Take by mouth.  ? Coenzyme Q10 (COQ10) 200 MG CAPS Take 1 capsule by mouth daily.  ? famotidine (PEPCID) 20 MG tablet Take 20 mg by mouth as needed for heartburn or indigestion.  ? loratadine (CLARITIN) 10 MG tablet Take 10 mg by mouth daily.  ? metoprolol tartrate (LOPRESSOR) 25 MG tablet Take 1 tablet (25 mg total) by mouth as needed (atrial fibrillation).  ? rivaroxaban (XARELTO) 20 MG TABS tablet Take 1 tablet (20 mg total) by mouth daily with supper.  ? Spirulina 500 MG TABS Take by mouth.  ?  ? ?Allergies:   Macrobid [nitrofurantoin] and Diltiazem  ? ?Social History  ? ?Socioeconomic History  ? Marital status: Married  ?  Spouse name: Not on file  ? Number of children: Not on file  ? Years of education: Not on file  ? Highest education level: Not on file  ?Occupational History  ? Not on file  ?Tobacco Use  ? Smoking status: Former  ?  Packs/day: 0.20  ?  Years:  15.00  ?  Pack years: 3.00  ?  Types: Cigarettes  ?  Quit date: 07/18/1996  ?  Years since quitting: 25.3  ? Smokeless tobacco: Never  ?Substance and Sexual Activity  ? Alcohol use: Yes  ?  Alcohol/week: 21.0 standard drinks  ?  Types: 21 Glasses of wine per week  ?  Comment: 3 glasses of wine daily  ? Drug use: Yes  ?  Comment: Phentermine, Tramadol  ? Sexual activity: Yes  ?  Birth control/protection: Surgical  ?Other Topics Concern  ? Not on file  ?Social History Narrative  ? Not on file  ? ?Social Determinants of Health  ? ?Financial Resource Strain: Not on file  ?Food Insecurity: Not on file  ?Transportation Needs: Not on file  ?Physical Activity: Not on file  ?Stress: Not on file  ?Social Connections: Not on file  ?  ? ?Family History: ?The patient's family history includes Asthma in her father; Heart  disease in her mother; Hyperlipidemia in her father; Other in her mother; Stomach cancer in her paternal grandfather. There is no history of Colon cancer, Esophageal cancer, Rectal cancer, or Breast cancer. ? ?ROS:   ?Please see the history of present illness.    ? All other systems reviewed and are negative. ? ?EKGs/Labs/Other Studies Reviewed:   ? ?The following studies were reviewed today: ? ? ?EKG:   ?11/18/20: Afib, rate 86,  ? ? ?Recent Labs: ?06/20/2021: ALT 21; Hemoglobin 15.0; Platelets 276 ?06/21/2021: BUN 21; Creatinine, Ser 0.82; Magnesium 2.1; Potassium 4.8; Sodium 136; TSH 2.557  ?Recent Lipid Panel ?   ?Component Value Date/Time  ? CHOL 178 06/21/2021 0818  ? TRIG 68 06/21/2021 0818  ? HDL 69 06/21/2021 0818  ? CHOLHDL 2.6 06/21/2021 0818  ? VLDL 14 06/21/2021 0818  ? [place] 95 06/21/2021 0818  ? ? ?Physical Exam:   ? ?VS:  BP 118/72   Pulse 88   Ht '5\' 8"'$  (1.727 m)   Wt 170 lb (77.1 kg)   SpO2 98%   BMI 25.85 kg/m?    ? ?Wt Readings from Last 3 Encounters:  ?11/18/21 170 lb (77.1 kg)  ?08/17/21 169 lb (76.7 kg)  ?06/20/21 160 lb (72.6 kg)  ?  ? ?GEN:  Well nourished, well developed in no acute distress ?HEENT: Normal ?NECK: No JVD; No carotid bruits ?LYMPHATICS: No lymphadenopathy ?CARDIAC: RRR, no murmurs, rubs, gallops ?RESPIRATORY:  Clear to auscultation without rales, wheezing or rhonchi  ?ABDOMEN: Soft, non-tender, non-distended ?MUSCULOSKELETAL:  No edema; No deformity  ?SKIN: Warm and dry ?NEUROLOGIC:  Alert and oriented x 3 ?PSYCHIATRIC:  Normal affect  ? ?ASSESSMENT:   ? ?1. Paroxysmal atrial fibrillation (HCC)   ?2. Hyperlipidemia LDL goal <70   ? ? ?PLAN:   ? ?Paroxysmal atrial fibrillation: Presented with A. fib with RVR 08/2020 after taking phentermine and tramadol, in setting of COVID-19 infection.  Echocardiogram on 09/25/20 showed normal biventricular function, no significant valvular disease (read as bicuspid AV but appears tricuspid on my read, no AI/AS).  She declined  anticoagulation.  She was admitted 06/2021 with acute right-sided weakness, thought to be TIA.  She was started on Eliquis at that time.  Echocardiogram 06/21/2021 showed normal biventricular function, no significant valvular disease, negative bubble study.  Zio patch x2 weeks on 07/20/2021 showed 11% atrial fibrillation burden with longest episode lasting 19 hours; average heart rate in A-fib 97 bpm.  She is in A-fib at clinic appointment today, rates controlled ?-She is drinking 2  glasses of wine per night, recommend cutting back on alcohol use as could be contributing to A-fib ?-She had rash on diltiazem, has stopped taking.  Discussed starting metoprolol but she declines.  She is agreeable to take as needed metoprolol if having A-fib with RVR ?-She stopped taking Eliquis, reports intolerance.  Stressed the importance of taking anticoagulation given her recent TIA.  She is agreeable to trying Xarelto.  She is very sensitive to medications and would not like to be on any meds.  Would recommend referral to Dr. Quentin Ore to evaluate for ablation and/or Watchman ? ?Hyperlipidemia: LDL 95 06/2021.  She was prescribed Lipitor but stopped taking. ? ?RTC in 6 months ? ? ?[place] ?Referral for Left Atrial Appendage Closure with Non-Valvular Atrial Fibrillation  ? ?SIHAM BUCARO is a 56 y.o. female is being referred to the [facility] Team for evaluation for Left Atrial Appendage Closure with Watchman device for the management of stroke risk resulting form non-valvular atrial fibrillation.   ? ?Base upon Ms. Penza's history, she is felt to be a poor candidate for long-term anticoagulation because of documented poor compliance with anticoagulant therapy.  The patient has a HAS-BLED score of 2 indicating a Yearly Major Bleeding Risk of 1.88%.     ? ?Her CHADS2-VASc Score is 3 with an unadjusted Ischemic Stroke Rate (% per year) of 3.2%.   ? ?Her stroke risk necessitates a strategy of stroke  prevention with either long-term oral anticoagulation or left atrial appendage occlusion therapy. We have discussed their bleeding risk in the context of their comorbid medical problems, as well as the rationale for referral

## 2021-11-18 ENCOUNTER — Ambulatory Visit (INDEPENDENT_AMBULATORY_CARE_PROVIDER_SITE_OTHER): Payer: BC Managed Care – PPO | Admitting: Cardiology

## 2021-11-18 ENCOUNTER — Encounter: Payer: Self-pay | Admitting: Cardiology

## 2021-11-18 VITALS — BP 118/72 | HR 88 | Ht 68.0 in | Wt 170.0 lb

## 2021-11-18 DIAGNOSIS — E785 Hyperlipidemia, unspecified: Secondary | ICD-10-CM

## 2021-11-18 DIAGNOSIS — I480 Paroxysmal atrial fibrillation: Secondary | ICD-10-CM | POA: Diagnosis not present

## 2021-11-18 MED ORDER — METOPROLOL TARTRATE 25 MG PO TABS: 25.0000 mg | ORAL_TABLET | ORAL | 3 refills | Status: DC | PRN

## 2021-11-18 MED ORDER — RIVAROXABAN 20 MG PO TABS: 20.0000 mg | ORAL_TABLET | Freq: Every day | ORAL | 3 refills | Status: DC

## 2021-11-18 NOTE — Patient Instructions (Signed)
Medication Instructions:  ?STOP Eliquis ?START Xarelto 20 mg daily with food ?Take metoprolol 25 mg AS NEEDED for atrial fibrillation ? ?*If you need a refill on your cardiac medications before your next appointment, please call your pharmacy* ? ?Follow-Up: ?At [facility], you and your health needs are our priority.  As part of our continuing mission to provide you with exceptional heart care, we have created designated Provider Care Teams.  These Care Teams include your primary Cardiologist (physician) and Advanced Practice Providers (APPs -  Physician Assistants and Nurse Practitioners) who all work together to provide you with the care you need, when you need it. ? ?We recommend signing up for the patient portal called "MyChart".  Sign up information is provided on this After Visit Summary.  MyChart is used to connect with patients for Virtual Visits (Telemedicine).  Patients are able to view lab/test results, encounter notes, upcoming appointments, etc.  Non-urgent messages can be sent to your provider as well.   ?To learn more about what you can do with MyChart, go to [email or web address].   ? ?Your next appointment:   ?6 month(s) ? ?The format for your next appointment:   ?In Person ? ?Provider:   ?Donato Heinz, MD { ? ? ?Other Instructions ?You have been referred to: Dr. Quentin Ore, Electrophysiology ? ? ?Important Information About Sugar ? ? ? ? ? ? ?

## 2021-12-10 DIAGNOSIS — R059 Cough, unspecified: Secondary | ICD-10-CM | POA: Diagnosis not present

## 2021-12-10 DIAGNOSIS — J209 Acute bronchitis, unspecified: Secondary | ICD-10-CM | POA: Diagnosis not present

## 2021-12-23 DIAGNOSIS — Z0000 Encounter for general adult medical examination without abnormal findings: Secondary | ICD-10-CM | POA: Diagnosis not present

## 2022-01-04 ENCOUNTER — Encounter: Payer: Self-pay | Admitting: Cardiology

## 2022-01-04 ENCOUNTER — Ambulatory Visit: Payer: BC Managed Care – PPO | Admitting: Cardiology

## 2022-01-04 VITALS — BP 138/78 | HR 56 | Ht 68.0 in | Wt 171.0 lb

## 2022-01-04 DIAGNOSIS — I480 Paroxysmal atrial fibrillation: Secondary | ICD-10-CM | POA: Diagnosis not present

## 2022-01-04 DIAGNOSIS — G459 Transient cerebral ischemic attack, unspecified: Secondary | ICD-10-CM

## 2022-01-04 MED ORDER — APIXABAN 5 MG PO TABS: 5.0000 mg | ORAL_TABLET | Freq: Two times a day (BID) | ORAL | 4 refills | Status: DC

## 2022-01-04 NOTE — Progress Notes (Signed)
Electrophysiology Office Note:    Date:  01/04/2022   ID:  Kim Irwin, Kim Irwin 04-17-1966, MRN [id number]  PCP:  Everardo Beals, NP  [facility] Cardiologist:  Donato Heinz, MD  [place] Electrophysiologist:  None   Referring MD: Donato Heinz*   Chief Complaint: New patient consult for ablation and watchman  History of Present Illness:    Kim Irwin is a 56 y.o. female who presents for an evaluation for ablation and watchman procedures at the request of Dr. Gardiner Rhyme. Their medical history includes paroxysmal atrial fibrillation, pneumonia, GERD, and arthritis.  Initially presented to [facility] 09/05/20 with new onset atrial fibrillation with RVR after taking phentermine and tramadol. She was also COVID-19 positive. In the ED she failed DCCV x2, but converted to NSR on IV amiodarone and diltiazem. Her CHADS2VASC score was 1, she declined anticoagulation. On 06/2021 she was admitted with concerns for TIA indicated by right-sided weakness. She was started on Eliquis.  She saw Dr. Gardiner Rhyme on 11/18/2021 where she was in Afib at 86 bpm per her EKG. She had stopped taking diltiazem due to developing a rash. She also stopped taking Eliquis due to reported intolerance and sensitivity to medications. It was noted she was drinking 2 glasses of wine per night. She was amenable to trying Xarelto, and she was referred to EP for discussion of possible ablation and/or watchman.  Today, she confirms being able to feel "weird" palpitations and some shortness of breath when she is in Afib. In the past few days she has not noticed any episodes. Typically she may have 2 arrhythmic episodes in a week. She also notes that stress is a known trigger of her Afib.   She never started the Xarelto, and she is not currently taking metoprolol.  She denies any chest pain, shortness of breath, or peripheral edema. No lightheadedness, headaches, syncope, orthopnea, or PND.     Past  Medical History:  Diagnosis Date   Allergy    macrobid   Anxiety    Arthritis    hands   GERD (gastroesophageal reflux disease)    Hearing loss    slight, no hearing aids   Migraine    otc med prn   Paroxysmal A-fib (HCC)    Pneumonia    hx - resolved   SVD (spontaneous vaginal delivery)    x 1   Urinary tract infection    Hx - resolved    Past Surgical History:  Procedure Laterality Date   COLONOSCOPY  09/2003   Normal - [facility]   endometrial ablation     TUBAL LIGATION     WRIST SURGERY Right     Current Medications: Current Meds  Medication Sig   [START ON 03/24/2022] apixaban (ELIQUIS) 5 MG TABS tablet Take 1 tablet (5 mg total) by mouth 2 (two) times daily.   Ascorbic Acid (VITAMIN C) 500 MG CAPS Take by mouth.   aspirin EC 81 MG tablet Take 81 mg by mouth daily. Swallow whole.   Calcium Carb-Magnesium Carb (MAGNEBIND 400 PO) Take 1 tablet by mouth daily.   Coenzyme Q10 (COQ10) 200 MG CAPS Take 1 capsule by mouth daily.   famotidine (PEPCID) 20 MG tablet Take 20 mg by mouth as needed for heartburn or indigestion.   loratadine (CLARITIN) 10 MG tablet Take 10 mg by mouth daily.   Spirulina 500 MG TABS Take by mouth.   Tetrahydrozoline HCl (VISINE OP) Place 1 drop into both eyes daily as needed (dry  eyes).     Allergies:   Macrobid [nitrofurantoin] and Diltiazem   Social History   Socioeconomic History   Marital status: Married    Spouse name: Not on file   Number of children: Not on file   Years of education: Not on file   Highest education level: Not on file  Occupational History   Not on file  Tobacco Use   Smoking status: Former    Packs/day: 0.20    Years: 15.00    Total pack years: 3.00    Types: Cigarettes    Quit date: 07/18/1996    Years since quitting: 25.4   Smokeless tobacco: Never  Substance and Sexual Activity   Alcohol use: Yes    Alcohol/week: 21.0 standard drinks of alcohol    Types: 21 Glasses of wine per week    Comment: 3 glasses of  wine daily   Drug use: Yes    Comment: Phentermine, Tramadol   Sexual activity: Yes    Birth control/protection: Surgical  Other Topics Concern   Not on file  Social History Narrative   Not on file   Social Determinants of Health   Financial Resource Strain: Not on file  Food Insecurity: Not on file  Transportation Needs: Not on file  Physical Activity: Not on file  Stress: Not on file  Social Connections: Not on file     Family History: The patient's family history includes Asthma in her father; Heart disease in her mother; Hyperlipidemia in her father; Other in her mother; Stomach cancer in her paternal grandfather. There is no history of Colon cancer, Esophageal cancer, Rectal cancer, or Breast cancer.  ROS:   Please see the history of present illness.    (+) Palpitations (+) Stress All other systems reviewed and are negative.  EKGs/Labs/Other Studies Reviewed:    The following studies were reviewed today:  07/2021  Monitor: 11% atrial fibrillation burden, longest episode lasting 19 hours. Average heart rate in A. fib was 97 bpm     Patch Wear Time:  14 days and 0 hours (2022-12-11T06:26:11-498 to 2022-12-25T06:26:03-499)   Patient had a min HR of 46 bpm, max HR of 203 bpm, and avg HR of 72 bpm. Predominant underlying rhythm was Sinus Rhythm. 1 run of Supraventricular Tachycardia occurred lasting 4 beats with a max rate of 124 bpm (avg 106 bpm). Atrial Fibrillation occurred  (11% burden), ranging from 53-203 bpm (avg of 97 bpm), the longest lasting 19 hours 16 mins with an avg rate of 94 bpm. Atrial Fibrillation was detected within +/- 45 seconds of symptomatic patient event(s). Isolated SVEs were rare (<1.0%), SVE Couplets  were rare (<1.0%), and SVE Triplets were rare (<1.0%). Isolated VEs were rare (<1.0%), VE Couplets were rare (<1.0%), and no VE Triplets were present.  15 patient triggered events, corresponding to atrial fibrillation in sinus rhythm  PACs  06/21/21   Echo:  1. Left ventricular ejection fraction, by estimation, is 55 to 60%. The  left ventricle has normal function. The left ventricle has no regional  wall motion abnormalities. Left ventricular diastolic parameters were  normal.   2. Right ventricular systolic function is normal. The right ventricular  size is normal. Tricuspid regurgitation signal is inadequate for assessing  PA pressure.   3. The mitral valve is normal in structure. Mild mitral valve  regurgitation. No evidence of mitral stenosis.   4. The aortic valve is tricuspid. Aortic valve regurgitation is not  visualized. Aortic valve sclerosis is present, with  no evidence of aortic  valve stenosis.   5. The inferior vena cava is normal in size with greater than 50%  respiratory variability, suggesting right atrial pressure of 3 mmHg.   6. Agitated saline contrast bubble study was negative, with no evidence  of any interatrial shunt. Technically difficult bubble study, but no  shunting seen.    EKG:   EKG is personally reviewed.  01/04/2022:  EKG was not ordered.   Recent Labs: 06/20/2021: ALT 21; Hemoglobin 15.0; Platelets 276 06/21/2021: BUN 21; Creatinine, Ser 0.82; Magnesium 2.1; Potassium 4.8; Sodium 136; TSH 2.557   Recent Lipid Panel    Component Value Date/Time   CHOL 178 06/21/2021 0818   TRIG 68 06/21/2021 0818   HDL 69 06/21/2021 0818   CHOLHDL 2.6 06/21/2021 0818   VLDL 14 06/21/2021 0818   LDLCALC 95 06/21/2021 0818    Physical Exam:    VS:  BP 138/78   Pulse (!) 56   Ht '5\' 8"'$  (1.727 m)   Wt 171 lb (77.6 kg)   SpO2 94%   BMI 26.00 kg/m     Wt Readings from Last 3 Encounters:  01/04/22 171 lb (77.6 kg)  11/18/21 170 lb (77.1 kg)  08/17/21 169 lb (76.7 kg)     GEN: Well nourished, well developed in no acute distress HEENT: Normal NECK: No JVD; No carotid bruits LYMPHATICS: No lymphadenopathy CARDIAC: RRR, no murmurs, rubs, gallops RESPIRATORY:  Clear to auscultation without rales,  wheezing or rhonchi  ABDOMEN: Soft, non-tender, non-distended MUSCULOSKELETAL:  No edema; No deformity  SKIN: Warm and dry NEUROLOGIC:  Alert and oriented x 3 PSYCHIATRIC:  Normal affect       ASSESSMENT:    1. Paroxysmal atrial fibrillation (HCC)    PLAN:    In order of problems listed above:  #Paroxysmal atrial fibrillation Symptomatic.  Not currently taking an anticoagulant given concerns of off target effects.  I discussed treatment options for her atrial fibrillation including antiarrhythmic drugs and catheter ablation.  The patient prefers a strategy that avoids long-term exposure to any medications.  She would like to proceed with catheter ablation.  I discussed the procedure in detail with the patient including the risks, recovery and likelihood of success.  I discussed the need for possible second ablation procedures.  I also discussed the stroke risk associated with her atrial fibrillation.  She does have a CHA2DS2-VASc of 3 given her gender and history of TIA.  The patient is willing to retry Eliquis for stroke prophylaxis and would like to avoid Xarelto.  I think this is a reasonable strategy.  I discussed the need for short-term anticoagulation after left atrial appendage occlusion procedure.  I discussed the Watchman procedure in detail including the risks, recovery and likelihood of having a successful implant.  She would like to proceed with sequential A-fib ablation followed by left atrial appendage occlusion.  We will plan for A-fib ablation first followed 6 to 8 weeks later by the watchman implant.  --------------------  I have seen Kim Irwin in the office today who is being considered for a Watchman left atrial appendage closure device. I believe they will benefit from this procedure given their history of atrial fibrillation, CHA2DS2-VASc score of 3 and unadjusted ischemic stroke rate of 3.2% per year. The patient's chart has been reviewed and I feel that they  would be a candidate for short term oral anticoagulation after Watchman implant.   It is my belief that after undergoing a LAA  closure procedure, Kim Irwin will not need long term anticoagulation which eliminates anticoagulation side effects and major bleeding risk.   Procedural risks for the Watchman implant have been reviewed with the patient including a 0.5% risk of stroke, <1% risk of perforation and <1% risk of device embolization. Other risks include bleeding, vascular damage, tamponade, worsening renal function, and death. The patient understands these risk and wishes to proceed.     The published clinical data on the safety and effectiveness of WATCHMAN include but are not limited to the following: - Holmes DR, Mechele Claude, Sick P et al. for the PROTECT AF Investigators. Percutaneous closure of the left atrial appendage versus warfarin therapy for prevention of stroke in patients with atrial fibrillation: a randomised non-inferiority trial. Lancet 2009; 374: 534-42. Mechele Claude, Doshi SK, Abelardo Diesel D et al. on behalf of the PROTECT AF Investigators. Percutaneous Left Atrial Appendage Closure for Stroke Prophylaxis in Patients With Atrial Fibrillation 2.3-Year Follow-up of the PROTECT AF (Watchman Left Atrial Appendage System for Embolic Protection in Patients With Atrial Fibrillation) Trial. Circulation 2013; 127:720-729. - Alli O, Doshi S,  Kar S, Reddy VY, Sievert H et al. Quality of Life Assessment in the Randomized PROTECT AF (Percutaneous Closure of the Left Atrial Appendage Versus Warfarin Therapy for Prevention of Stroke in Patients With Atrial Fibrillation) Trial of Patients at Risk for Stroke With Nonvalvular Atrial Fibrillation. J Am Coll Cardiol 2013; 82:5053-9. Vertell Limber DR, Tarri Abernethy, Price M, [place], Sievert H, Doshi S, Huber K, Reddy V. Prospective randomized evaluation of the Watchman left atrial appendage Device in patients with atrial fibrillation versus long-term  warfarin therapy; the PREVAIL trial. Journal of the [employer] of Cardiology, Vol. 4, No. 1, 2014, 1-11. - Kar S, Doshi SK, Sadhu A, Horton R, Osorio J et al. Primary outcome evaluation of a next-generation left atrial appendage closure device: results from the PINNACLE FLX trial. Circulation 2021;143(18)1754-1762.    After today's visit with the patient which was dedicated solely for shared decision making visit regarding LAA closure device, the patient decided to proceed with the LAA appendage closure procedure scheduled to be done in the near future at [facility]. Prior to the procedure, I would like to obtain a gated CT scan of the chest with contrast timed for PV/LA visualization.    HAS-BLED score 1 Hypertension No  Abnormal renal and liver function (Dialysis, transplant, Cr >2.26 mg/dL /Cirrhosis or Bilirubin >2x Normal or AST/ALT/AP >3x Normal) No  Stroke Yes  Bleeding No  Labile INR (Unstable/high INR) No  Elderly (>65) No  Drugs or alcohol (? 8 drinks/week, anti-plt or NSAID) No   CHA2DS2-VASc Score = 3  The patient's score is based upon: CHF History: 0 HTN History: 0 Diabetes History: 0 Stroke History: 2 Vascular Disease History: 0 Age Score: 0 Gender Score: 1    Total time spent with patient today 60 minutes. This includes reviewing records, evaluating the patient and coordinating care.  Medication Adjustments/Labs and Tests Ordered: Current medicines are reviewed at length with the patient today.  Concerns regarding medicines are outlined above.  Orders Placed This Encounter  Procedures   ECHOCARDIOGRAM COMPLETE   Meds ordered this encounter  Medications   apixaban (ELIQUIS) 5 MG TABS tablet    Sig: Take 1 tablet (5 mg total) by mouth 2 (two) times daily.    Dispense:  60 tablet    Refill:  4    Patient doesn't need at this time.  Will call when she needs to pick up.    I,Mathew Stumpf,acting as a Education administrator for Vickie Epley, MD.,have  documented all relevant documentation on the behalf of Vickie Epley, MD,as directed by  Vickie Epley, MD while in the presence of Vickie Epley, MD.  I, Vickie Epley, MD, have reviewed all documentation for this visit. The documentation on 01/04/22 for the exam, diagnosis, procedures, and orders are all accurate and complete.   Signed, Hilton Cork. Quentin Ore, MD, [facility], [facility] 01/04/2022 10:12 PM    Electrophysiology [facility]

## 2022-01-04 NOTE — Addendum Note (Signed)
Addended by: Lars Mage T on: 01/04/2022 10:16 PM   Modules accepted: Level of Service

## 2022-01-04 NOTE — Patient Instructions (Addendum)
Medication Instructions:  Start Eliquis 5 mg two times a day on SEPT 7 Stop Aspirin while you are taking Eliquis.  Your physician recommends that you continue on your current medications as directed. Please refer to the Current Medication list given to you today. *If you need a refill on your cardiac medications before your next appointment, please call your pharmacy*  Lab Work: None. If you have labs (blood work) drawn today and your tests are completely normal, you will receive your results only by: [place] (if you have MyChart) OR A paper copy in the mail If you have any lab test that is abnormal or we need to change your treatment, we will call you to review the results.  Testing/Procedures: Your physician has requested that you have an echocardiogram. Echocardiography is a painless test that uses sound waves to create images of your heart. It provides your doctor with information about the size and shape of your heart and how well your heart's chambers and valves are working. This procedure takes approximately one hour. There are no restrictions for this procedure.  Your physician has requested that you have cardiac CT. Cardiac computed tomography (CT) is a painless test that uses an x-ray machine to take clear, detailed pictures of your heart. For further information please visit [email or web address]. Please follow instruction sheet as given.  Your physician has recommended that you have an ablation. Catheter ablation is a medical procedure used to treat some cardiac arrhythmias (irregular heartbeats). During catheter ablation, a long, thin, flexible tube is put into a blood vessel in your groin (upper thigh), or neck. This tube is called an ablation catheter. It is then guided to your heart through the blood vessel. Radio frequency waves destroy small areas of heart tissue where abnormal heartbeats may cause an arrhythmia to start. Please see the instruction sheet given to you  today.   Follow-Up: At [facility], you and your health needs are our priority.  As part of our continuing mission to provide you with exceptional heart care, we have created designated Provider Care Teams.  These Care Teams include your primary Cardiologist (physician) and Advanced Practice Providers (APPs -  Physician Assistants and Nurse Practitioners) who all work together to provide you with the care you need, when you need it.  Your physician wants you to follow-up in: Ablation date picked is October 5 and pre op lab date is Sept 14. Otila Kluver RN with call you will instructions for CT and ablation.   Lenice Llamas, the Watchman Nurse Navigator, will call you after your CT once the [facility] Team has reviewed your imaging for an update on proceedings. [place]'s direct number is [phone] if you need assistance.   We recommend signing up for the patient portal called "MyChart".  Sign up information is provided on this After Visit Summary.  MyChart is used to connect with patients for Virtual Visits (Telemedicine).  Patients are able to view lab/test results, encounter notes, upcoming appointments, etc.  Non-urgent messages can be sent to your provider as well.   To learn more about what you can do with MyChart, go to [email or web address].    Any Other Special Instructions Will Be Listed Below (If Applicable).   Cardiac CT Angiogram A cardiac CT angiogram is a procedure to look at the heart and the area around the heart. It may be done to help find the cause of chest pains or other symptoms of heart disease. During this procedure, a substance called contrast dye is injected  into the blood vessels in the area to be checked. A large X-ray machine, called a CT scanner, then takes detailed pictures of the heart and the surrounding area. The procedure is also sometimes called a coronary CT angiogram, coronary artery scanning, or CTA. A cardiac CT angiogram allows the health care provider to see how well  blood is flowing to and from the heart. The health care provider will be able to see if there are any problems, such as: Blockage or narrowing of the coronary arteries in the heart. Fluid around the heart. Signs of weakness or disease in the muscles, valves, and tissues of the heart. Tell a health care provider about: Any allergies you have. This is especially important if you have had a previous allergic reaction to contrast dye. All medicines you are taking, including vitamins, herbs, eye drops, creams, and over-the-counter medicines. Any blood disorders you have. Any surgeries you have had. Any medical conditions you have. Whether you are pregnant or may be pregnant. Any anxiety disorders, chronic pain, or other conditions you have that may increase your stress or prevent you from lying still. What are the risks? Generally, this is a safe procedure. However, problems may occur, including: Bleeding. Infection. Allergic reactions to medicines or dyes. Damage to other structures or organs. Kidney damage from the contrast dye that is used. Increased risk of cancer from radiation exposure. This risk is low. Talk with your health care provider about: The risks and benefits of testing. How you can receive the lowest dose of radiation. What happens before the procedure? Wear comfortable clothing and remove any jewelry, glasses, dentures, and hearing aids. Follow instructions from your health care provider about eating and drinking. This may include: For 12 hours before the procedure -- avoid caffeine. This includes tea, coffee, soda, energy drinks, and diet pills. Drink plenty of water or other fluids that do not have caffeine in them. Being well hydrated can prevent complications. For 4-6 hours before the procedure -- stop eating and drinking. The contrast dye can cause nausea, but this is less likely if your stomach is empty. Ask your health care provider about changing or stopping your  regular medicines. This is especially important if you are taking diabetes medicines, blood thinners, or medicines to treat problems with erections (erectile dysfunction). What happens during the procedure?  Hair on your chest may need to be removed so that small sticky patches called electrodes can be placed on your chest. These will transmit information that helps to monitor your heart during the procedure. An IV will be inserted into one of your veins. You might be given a medicine to control your heart rate during the procedure. This will help to ensure that good images are obtained. You will be asked to lie on an exam table. This table will slide in and out of the CT machine during the procedure. Contrast dye will be injected into the IV. You might feel warm, or you may get a metallic taste in your mouth. You will be given a medicine called nitroglycerin. This will relax or dilate the arteries in your heart. The table that you are lying on will move into the CT machine tunnel for the scan. The person running the machine will give you instructions while the scans are being done. You may be asked to: Keep your arms above your head. Hold your breath. Stay very still, even if the table is moving. When the scanning is complete, you will be moved out of  the machine. The IV will be removed. The procedure may vary among health care providers and hospitals. What can I expect after the procedure? After your procedure, it is common to have: A metallic taste in your mouth from the contrast dye. A feeling of warmth. A headache from the nitroglycerin. Follow these instructions at home: Take over-the-counter and prescription medicines only as told by your health care provider. If you are told, drink enough fluid to keep your urine pale yellow. This will help to flush the contrast dye out of your body. Most people can return to their normal activities right after the procedure. Ask your health care  provider what activities are safe for you. It is up to you to get the results of your procedure. Ask your health care provider, or the department that is doing the procedure, when your results will be ready. Keep all follow-up visits as told by your health care provider. This is important. Contact a health care provider if: You have any symptoms of allergy to the contrast dye. These include: Shortness of breath. Rash or hives. A racing heartbeat. Summary A cardiac CT angiogram is a procedure to look at the heart and the area around the heart. It may be done to help find the cause of chest pains or other symptoms of heart disease. During this procedure, a large X-ray machine, called a CT scanner, takes detailed pictures of the heart and the surrounding area after a contrast dye has been injected into blood vessels in the area. Ask your health care provider about changing or stopping your regular medicines before the procedure. This is especially important if you are taking diabetes medicines, blood thinners, or medicines to treat erectile dysfunction. If you are told, drink enough fluid to keep your urine pale yellow. This will help to flush the contrast dye out of your body. This information is not intended to replace advice given to you by your health care provider. Make sure you discuss any questions you have with your health care provider. Document Revised: 03/17/2021 Document Reviewed: 02/27/2019 Elsevier Patient Education  [place].  Cardiac Ablation Cardiac ablation is a procedure to destroy (ablate) some heart tissue that is sending bad signals. These bad signals cause problems in heart rhythm. The heart has many areas that make these signals. If there are problems in these areas, they can make the heart beat in a way that is not normal. Destroying some tissues can help make the heart rhythm normal. Tell your doctor about: Any allergies you have. All medicines you are taking.  These include vitamins, herbs, eye drops, creams, and over-the-counter medicines. Any problems you or family members have had with medicines that make you fall asleep (anesthetics). Any blood disorders you have. Any surgeries you have had. Any medical conditions you have, such as kidney failure. Whether you are pregnant or may be pregnant. What are the risks? This is a safe procedure. But problems may occur, including: Infection. Bruising and bleeding. Bleeding into the chest. Stroke or blood clots. Damage to nearby areas of your body. Allergies to medicines or dyes. The need for a pacemaker if the normal system is damaged. Failure of the procedure to treat the problem. What happens before the procedure? Medicines Ask your doctor about: Changing or stopping your normal medicines. This is important. Taking aspirin and ibuprofen. Do not take these medicines unless your doctor tells you to take them. Taking other medicines, vitamins, herbs, and supplements. General instructions Follow instructions from your  doctor about what you cannot eat or drink. Plan to have someone take you home from the hospital or clinic. If you will be going home right after the procedure, plan to have someone with you for 24 hours. Ask your doctor what steps will be taken to prevent infection. What happens during the procedure?  An IV tube will be put into one of your veins. You will be given a medicine to help you relax. The skin on your neck or groin will be numbed. A cut (incision) will be made in your neck or groin. A needle will be put through your cut and into a large vein. A tube (catheter) will be put into the needle. The tube will be moved to your heart. Dye may be put through the tube. This helps your doctor see your heart. Small devices (electrodes) on the tube will send out signals. A type of energy will be used to destroy some heart tissue. The tube will be taken out. Pressure will be held on  your cut. This helps stop bleeding. A bandage will be put over your cut. The exact procedure may vary among doctors and hospitals. What happens after the procedure? You will be watched until you leave the hospital or clinic. This includes checking your heart rate, breathing rate, oxygen, and blood pressure. Your cut will be watched for bleeding. You will need to lie still for a few hours. Do not drive for 24 hours or as long as your doctor tells you. Summary Cardiac ablation is a procedure to destroy some heart tissue. This is done to treat heart rhythm problems. Tell your doctor about any medical conditions you may have. Tell him or her about all medicines you are taking to treat them. This is a safe procedure. But problems may occur. These include infection, bruising, bleeding, and damage to nearby areas of your body. Follow what your doctor tells you about food and drink. You may also be told to change or stop some of your medicines. After the procedure, do not drive for 24 hours or as long as your doctor tells you. This information is not intended to replace advice given to you by your health care provider. Make sure you discuss any questions you have with your health care provider. Document Revised: 06/06/2019 Document Reviewed: 06/06/2019 Elsevier Patient Education  [place].  Left Atrial Appendage Closure Device Implantation  Left atrial appendage (LAA) closure device implantation is a procedure to put a small device in the LAA of the heart. The LAA is a small sac in the wall of the heart's left upper chamber. Blood clots can form in the LAA in people with atrial fibrillation (AFib). The device closes the LAA to help prevent a blood clot and stroke. AFib is a type of irregular or rapid heartbeat (arrhythmia). There is an increased risk of blood clots and stroke with AFib. This procedure helps to reduce that risk. Tell a health care provider about: Any allergies you have. All  medicines you are taking, including vitamins, herbs, eye drops, creams, and over-the-counter medicines. Any problems you or family members have had with anesthetic medicines. Any blood disorders you have. Any surgeries you have had. Any medical conditions you have. Whether you are pregnant or may be pregnant. What are the risks? Generally, this is a safe procedure. However, problems may occur, including: Infection. Bleeding. Allergic reactions to medicines or dyes. Damage to nearby structures or organs. Heart attack. Stroke. Blood clots. Changes in heart rhythm.  Device failure. What happens before the procedure? Staying hydrated Follow instructions from your health care provider about hydration, which may include: Up to 2 hours before the procedure - you may continue to drink clear liquids, such as water, clear fruit juice, black coffee, and plain tea. Eating and drinking restrictions Follow instructions from your health care provider about eating and drinking, which may include: 8 hours before the procedure - stop eating heavy meals or foods, such as meat, fried foods, or fatty foods. 6 hours before the procedure - stop eating light meals or foods, such as toast or cereal. 6 hours before the procedure - stop drinking milk or drinks that contain milk. 2 hours before the procedure - stop drinking clear liquids. Medicines Ask your health care provider about: Changing or stopping your regular medicines. This is especially important if you are taking diabetes medicines or blood thinners. Taking medicines such as aspirin and ibuprofen. These medicines can thin your blood. Do not take these medicines unless your health care provider tells you to take them. Taking over-the-counter medicines, vitamins, herbs, and supplements. Tests You may have blood tests and a physical exam. You may have an electrocardiogram (ECG). This test checks your heart's electrical patterns and rhythms. General  instructions Do not use any products that contain nicotine or tobacco. These include cigarettes, chewing tobacco, and vaping devices, such as e-cigarettes. If you need help quitting, ask your health care provider. Ask your health care provider what steps will be taken to help prevent infection. These steps may include: Removing hair at the surgery site. Washing skin with a germ-killing soap. Taking antibiotic medicine. Plan to have a responsible adult take you home from the hospital or clinic. Plan to have a responsible adult care for you for the time you are told after you leave the hospital or clinic. This is important. What happens during the procedure? An IV will be inserted into one of your veins. You will be given one or more of the following: A medicine to help you relax (sedative). A medicine to make you fall asleep (general anesthetic). A small incision will be made in your groin area. A small wire will be put through the incision and into a blood vessel. Dye may be injected so X-rays can be used to guide the wire through the blood vessel. A long, thin tube (catheter) will be put over the small wire and moved up through the blood vessel to reach your heart. The closure device will be moved through the catheter until it reaches your heart. A small hole will be made in the septum (transseptal puncture). The septum is a thin tissue that separates the upper two chambers of the heart. The device will be placed so that it closes the LAA. X-rays will be done to make sure the device is in the right place. The catheter and wire will be removed. The closure device will remain in your heart. After pressure is applied over the catheter site to prevent bleeding, a bandage (dressing) will be placed over the site where the catheter was inserted. The procedure may vary among health care providers and hospitals. What happens after the procedure? Your blood pressure, heart rate, breathing rate, and  blood oxygen level will be monitored until you leave the hospital or clinic. You may have to wear compression stockings. These stockings help to prevent blood clots and reduce swelling in your legs. If you were given a sedative during the procedure, it can affect you for several  hours. Do not drive or operate machinery until your health care provider says it is safe. You may be given pain medicine. You may need to drink more fluids to wash (flush) the dye out of your body. Drink enough fluid to keep your urine pale yellow. Take over-the-counter and prescription medicines only as told by your health care provider. This is especially important if you were given blood thinners. Summary Left atrial appendage (LAA) closure device implantation is a procedure that is done to put a small device in the LAA of the heart. The LAA is a small sac in the wall of the heart's left upper chamber. The device closes the LAA to prevent stroke and other problems. Follow instructions from your health care provider before and after the procedure. This information is not intended to replace advice given to you by your health care provider. Make sure you discuss any questions you have with your health care provider. Document Revised: 03/12/2020 Document Reviewed: 03/12/2020 Elsevier Patient Education  [place].       .

## 2022-01-05 ENCOUNTER — Encounter: Payer: Self-pay | Admitting: Cardiology

## 2022-01-05 NOTE — Addendum Note (Signed)
Addended by: Darrell Jewel on: 01/05/2022 11:53 AM   Modules accepted: Orders

## 2022-01-21 ENCOUNTER — Other Ambulatory Visit (HOSPITAL_COMMUNITY): Payer: BC Managed Care – PPO

## 2022-01-26 DIAGNOSIS — M62830 Muscle spasm of back: Secondary | ICD-10-CM | POA: Diagnosis not present

## 2022-01-26 DIAGNOSIS — M546 Pain in thoracic spine: Secondary | ICD-10-CM | POA: Diagnosis not present

## 2022-01-31 ENCOUNTER — Other Ambulatory Visit: Payer: Self-pay | Admitting: Obstetrics and Gynecology

## 2022-01-31 DIAGNOSIS — Z1231 Encounter for screening mammogram for malignant neoplasm of breast: Secondary | ICD-10-CM

## 2022-02-14 ENCOUNTER — Encounter: Payer: Self-pay | Admitting: *Deleted

## 2022-02-14 ENCOUNTER — Telehealth: Payer: Self-pay | Admitting: *Deleted

## 2022-02-14 DIAGNOSIS — Z01818 Encounter for other preprocedural examination: Secondary | ICD-10-CM

## 2022-02-14 DIAGNOSIS — I480 Paroxysmal atrial fibrillation: Secondary | ICD-10-CM

## 2022-02-14 NOTE — Telephone Encounter (Signed)
Went over CT and Ablation instructions with the patient. Answered all questions.  Verbalized understanding and agreement.

## 2022-03-18 ENCOUNTER — Ambulatory Visit
Admission: RE | Admit: 2022-03-18 | Discharge: 2022-03-18 | Disposition: A | Discharge status: Home / Self Care | Payer: BC Managed Care – PPO | Source: Ambulatory Visit | Attending: Obstetrics and Gynecology | Admitting: Obstetrics and Gynecology

## 2022-03-18 DIAGNOSIS — Z1231 Encounter for screening mammogram for malignant neoplasm of breast: Secondary | ICD-10-CM | POA: Diagnosis not present

## 2022-03-28 NOTE — Addendum Note (Signed)
Addended by: Darrell Jewel on: 03/28/2022 09:28 AM   Modules accepted: Orders

## 2022-03-31 ENCOUNTER — Ambulatory Visit: Payer: BC Managed Care – PPO | Attending: Cardiology

## 2022-03-31 DIAGNOSIS — Z01818 Encounter for other preprocedural examination: Secondary | ICD-10-CM

## 2022-03-31 DIAGNOSIS — I480 Paroxysmal atrial fibrillation: Secondary | ICD-10-CM

## 2022-03-31 LAB — BASIC METABOLIC PANEL
BUN/Creatinine Ratio: 15 (ref 9–23)
BUN: 12 mg/dL (ref 6–24)
CO2: 25 mmol/L (ref 20–29)
Calcium: 9.5 mg/dL (ref 8.7–10.2)
Chloride: 107 mmol/L — ABNORMAL HIGH (ref 96–106)
Creatinine, Ser: 0.78 mg/dL (ref 0.57–1.00)
Glucose: 79 mg/dL (ref 70–99)
Potassium: 4.8 mmol/L (ref 3.5–5.2)
Sodium: 142 mmol/L (ref 134–144)
eGFR: 88 mL/min/{1.73_m2} (ref >59)

## 2022-03-31 LAB — CBC WITH DIFFERENTIAL/PLATELET
Basophils Absolute: 0.1 10*3/uL (ref 0.0–0.2)
Basos: 1 %
EOS (ABSOLUTE): 0.2 10*3/uL (ref 0.0–0.4)
Eos: 2 %
Hematocrit: 40.1 % (ref 34.0–46.6)
Hemoglobin: 13.4 g/dL (ref 11.1–15.9)
Lymphocytes Absolute: 2.4 10*3/uL (ref 0.7–3.1)
Lymphs: 39 %
MCH: 30.7 pg (ref 26.6–33.0)
MCHC: 33.4 g/dL (ref 31.5–35.7)
MCV: 92 fL (ref 79–97)
Monocytes Absolute: 0.7 10*3/uL (ref 0.1–0.9)
Monocytes: 11 %
Neutrophils Absolute: 2.9 10*3/uL (ref 1.4–7.0)
Neutrophils: 47 %
Platelets: 274 10*3/uL (ref 150–450)
RBC: 4.37 x10E6/uL (ref 3.77–5.28)
RDW: 13 % (ref 11.7–15.4)
WBC: 6.2 10*3/uL (ref 3.4–10.8)

## 2022-04-11 ENCOUNTER — Telehealth (HOSPITAL_COMMUNITY): Payer: Self-pay | Admitting: Emergency Medicine

## 2022-04-11 NOTE — Telephone Encounter (Signed)
Attempted to call patient regarding upcoming cardiac CT appointment. °Left message on voicemail with name and callback number °Tauno Falotico RN Navigator Cardiac Imaging °[facility] °[phone] Office °[phone] Cell ° °

## 2022-04-11 NOTE — Telephone Encounter (Signed)
Reaching out to patient to offer assistance regarding upcoming cardiac imaging study; pt verbalizes understanding of appt date/time, parking situation and where to check in, pre-test NPO status and medications ordered, and verified current allergies; name and call back number provided for further questions should they arise Marchia Bond RN Navigator Cardiac Imaging [facility] [phone] office [phone] cell  Denies iv issues  Arrival 100 w/c entrance Daily meds except allergy meds and HCTZ

## 2022-04-13 ENCOUNTER — Ambulatory Visit (HOSPITAL_COMMUNITY)
Admission: RE | Admit: 2022-04-13 | Discharge: 2022-04-13 | Disposition: A | Discharge status: Home / Self Care | Payer: BC Managed Care – PPO | Source: Ambulatory Visit | Attending: Cardiology | Admitting: Cardiology

## 2022-04-13 ENCOUNTER — Ambulatory Visit (HOSPITAL_BASED_OUTPATIENT_CLINIC_OR_DEPARTMENT_OTHER): Payer: BC Managed Care – PPO

## 2022-04-13 DIAGNOSIS — I480 Paroxysmal atrial fibrillation: Secondary | ICD-10-CM | POA: Diagnosis not present

## 2022-04-13 DIAGNOSIS — Z01818 Encounter for other preprocedural examination: Secondary | ICD-10-CM | POA: Diagnosis not present

## 2022-04-13 MED ORDER — METOPROLOL TARTRATE 5 MG/5ML IV SOLN
INTRAVENOUS | Status: AC
  Administered 2022-04-13: 5 mg via INTRAVENOUS
  Filled 2022-04-13: qty 10

## 2022-04-13 MED ORDER — METOPROLOL TARTRATE 5 MG/5ML IV SOLN
10.0000 mg | INTRAVENOUS | Status: DC | PRN
  Administered 2022-04-13: 5 mg via INTRAVENOUS

## 2022-04-13 MED ORDER — IOHEXOL 350 MG/ML SOLN
100.0000 mL | Freq: Once | INTRAVENOUS | Status: AC | PRN
  Administered 2022-04-13: 100 mL via INTRAVENOUS

## 2022-04-13 MED ORDER — METOPROLOL TARTRATE 5 MG/5ML IV SOLN: 10.0000 mg | INTRAVENOUS | Status: DC | PRN

## 2022-04-19 ENCOUNTER — Telehealth: Payer: Self-pay

## 2022-04-19 ENCOUNTER — Telehealth: Payer: Self-pay | Admitting: Cardiology

## 2022-04-19 DIAGNOSIS — Z01818 Encounter for other preprocedural examination: Secondary | ICD-10-CM

## 2022-04-19 DIAGNOSIS — I480 Paroxysmal atrial fibrillation: Secondary | ICD-10-CM

## 2022-04-19 NOTE — Telephone Encounter (Signed)
-----   Message from Vickie Epley, MD sent at 04/13/2022  9:17 PM EDT ----- CT looks like she won't be a candidate for Watchman. Will be ablation only.  Lysbeth Galas T. Quentin Ore, MD, [facility], [facility] Cardiac Electrophysiology

## 2022-04-19 NOTE — Telephone Encounter (Signed)
Pt calling to reschedule Ablation that is to be done on 04/21/22 due to her having the Flu. Please advise

## 2022-04-19 NOTE — Telephone Encounter (Signed)
Informed the patient that she is not a candidate for Watchman at this time.  She will have ablation when she feels better (she has the flu). She was grateful for call and agrees with plan.

## 2022-04-19 NOTE — Telephone Encounter (Signed)
I spoke with patient. She has not been diagnosed with the flu but she has had flu in the past and felt the same way.  Procedure canceled.  Patient aware she will be called to reschedule

## 2022-04-21 ENCOUNTER — Encounter (HOSPITAL_COMMUNITY): Admission: RE | Payer: BC Managed Care – PPO | Source: Home / Self Care

## 2022-04-21 ENCOUNTER — Ambulatory Visit (HOSPITAL_COMMUNITY): Admission: RE | Admit: 2022-04-21 | Payer: BC Managed Care – PPO | Source: Home / Self Care | Admitting: Cardiology

## 2022-04-21 ENCOUNTER — Encounter: Payer: Self-pay | Admitting: *Deleted

## 2022-04-21 SURGERY — ATRIAL FIBRILLATION ABLATION
Anesthesia: General

## 2022-04-21 NOTE — Telephone Encounter (Signed)
Spoke to the patient about rescheduling her Afib Ablation. Date picked is Nov 17 with preop labs Nov 3. Sent new instruction letter and answered all questions. Rescheduled with lab.    Re work up completed.

## 2022-04-26 DIAGNOSIS — J209 Acute bronchitis, unspecified: Secondary | ICD-10-CM | POA: Diagnosis not present

## 2022-04-26 DIAGNOSIS — R059 Cough, unspecified: Secondary | ICD-10-CM | POA: Diagnosis not present

## 2022-05-19 ENCOUNTER — Ambulatory Visit (HOSPITAL_COMMUNITY): Payer: BC Managed Care – PPO | Admitting: Nurse Practitioner

## 2022-05-20 ENCOUNTER — Encounter: Payer: Self-pay | Admitting: Cardiology

## 2022-05-20 ENCOUNTER — Ambulatory Visit: Payer: BC Managed Care – PPO | Attending: Cardiology

## 2022-05-20 DIAGNOSIS — I480 Paroxysmal atrial fibrillation: Secondary | ICD-10-CM | POA: Diagnosis not present

## 2022-05-20 DIAGNOSIS — Z01818 Encounter for other preprocedural examination: Secondary | ICD-10-CM

## 2022-05-21 LAB — CBC WITH DIFFERENTIAL/PLATELET
Basophils Absolute: 0.1 10*3/uL (ref 0.0–0.2)
Basos: 1 %
EOS (ABSOLUTE): 0.1 10*3/uL (ref 0.0–0.4)
Eos: 2 %
Hematocrit: 40.3 % (ref 34.0–46.6)
Hemoglobin: 13.5 g/dL (ref 11.1–15.9)
Immature Grans (Abs): 0 10*3/uL (ref 0.0–0.1)
Immature Granulocytes: 0 %
Lymphocytes Absolute: 3.1 10*3/uL (ref 0.7–3.1)
Lymphs: 42 %
MCH: 30.8 pg (ref 26.6–33.0)
MCHC: 33.5 g/dL (ref 31.5–35.7)
MCV: 92 fL (ref 79–97)
Monocytes Absolute: 0.9 10*3/uL (ref 0.1–0.9)
Monocytes: 12 %
Neutrophils Absolute: 3.3 10*3/uL (ref 1.4–7.0)
Neutrophils: 43 %
Platelets: 270 10*3/uL (ref 150–450)
RBC: 4.39 x10E6/uL (ref 3.77–5.28)
RDW: 13 % (ref 11.7–15.4)
WBC: 7.4 10*3/uL (ref 3.4–10.8)

## 2022-05-21 LAB — BASIC METABOLIC PANEL
BUN/Creatinine Ratio: 19 (ref 9–23)
BUN: 16 mg/dL (ref 6–24)
CO2: 22 mmol/L (ref 20–29)
Calcium: 9 mg/dL (ref 8.7–10.2)
Chloride: 102 mmol/L (ref 96–106)
Creatinine, Ser: 0.83 mg/dL (ref 0.57–1.00)
Glucose: 69 mg/dL — ABNORMAL LOW (ref 70–99)
Potassium: 4.4 mmol/L (ref 3.5–5.2)
Sodium: 137 mmol/L (ref 134–144)
eGFR: 82 mL/min/{1.73_m2} (ref >59)

## 2022-06-02 NOTE — Pre-Procedure Instructions (Signed)
Instructed patient on the following items: Arrival time 0530 Nothing to eat or drink after midnight No meds AM of procedure Responsible person to drive you home and stay with you for 24 hrs  Have you missed any doses of anti-coagulant Eliquis- hasn't missed any doses    

## 2022-06-02 NOTE — Anesthesia Preprocedure Evaluation (Addendum)
Anesthesia Evaluation  Patient identified by MRN, date of birth, ID band Patient awake    Reviewed: Allergy & Precautions, NPO status , Patient's Chart, lab work & pertinent test results  Airway Mallampati: II  TM Distance: >3 FB Neck ROM: Full    Dental no notable dental hx. (+) Dental Advisory Given, Teeth Intact   Pulmonary pneumonia, former smoker   Pulmonary exam normal breath sounds clear to auscultation       Cardiovascular + Valvular Problems/Murmurs  Rhythm:Irregular Rate:Normal  Echo 04/2021  1. Left ventricular ejection fraction, by estimation, is 55 to 60%. The left ventricle has normal function. The left ventricle has no regional wall motion abnormalities. Left ventricular diastolic parameters were normal.   2. Right ventricular systolic function is normal. The right ventricular size is normal. Tricuspid regurgitation signal is inadequate for assessing PA pressure.   3. The mitral valve is normal in structure. Mild mitral valve regurgitation. No evidence of mitral stenosis.   4. The aortic valve is tricuspid. Aortic valve regurgitation is not visualized. Aortic valve sclerosis is present, with no evidence of aortic valve stenosis.   5. The inferior vena cava is normal in size with greater than 50% respiratory variability, suggesting right atrial pressure of 3 mmHg.   6. Agitated saline contrast bubble study was negative, with no evidence of any interatrial shunt. Technically difficult bubble study, but no shunting seen.      Echo 09/2020  1. The aortic valve appears bicuspid. There is mild calcification of the aortic valve. There is mild thickening of the aortic valve. Aortic valve regurgitation is not visualized. No aortic stenosis is present. No spectral Doppler evidence of coarcation.   2. Left ventricular ejection fraction, by estimation, is 60 to 65%. The left ventricle has normal function. The left ventricle has no  regional wall motion abnormalities. Left ventricular diastolic parameters were normal. The average left ventricular global longitudinal strain is -25.5 %. The global longitudinal strain is normal.   3. Right ventricular systolic function is normal. The right ventricular size is normal.   4. The mitral valve is normal in structure. Trivial mitral valve regurgitation.   Comparison(s): No prior Echocardiogram.     Neuro/Psych  Headaches PSYCHIATRIC DISORDERS Anxiety     TIA   GI/Hepatic negative GI ROS, Neg liver ROS,GERD  ,,  Endo/Other  negative endocrine ROS    Renal/GU negative Renal ROS     Musculoskeletal  (+) Arthritis ,    Abdominal   Peds  Hematology negative hematology ROS (+)   Anesthesia Other Findings   Reproductive/Obstetrics                             Anesthesia Physical Anesthesia Plan  ASA: 3  Anesthesia Plan: General   Post-op Pain Management: Minimal or no pain anticipated   Induction: Intravenous  PONV Risk Score and Plan: 3 and Ondansetron, Dexamethasone, Treatment may vary due to age or medical condition and Midazolam  Airway Management Planned: Oral ETT  Additional Equipment:   Intra-op Plan:   Post-operative Plan: Extubation in OR  Informed Consent: I have reviewed the patients History and Physical, chart, labs and discussed the procedure including the risks, benefits and alternatives for the proposed anesthesia with the patient or authorized representative who has indicated his/her understanding and acceptance.     Dental advisory given  Plan Discussed with: CRNA  Anesthesia Plan Comments:  Anesthesia Quick Evaluation  

## 2022-06-03 ENCOUNTER — Other Ambulatory Visit: Payer: Self-pay

## 2022-06-03 ENCOUNTER — Ambulatory Visit (HOSPITAL_COMMUNITY)
Admission: RE | Admit: 2022-06-03 | Discharge: 2022-06-03 | Disposition: A | Discharge status: Home / Self Care | Payer: BC Managed Care – PPO | Attending: Cardiology | Admitting: Cardiology

## 2022-06-03 ENCOUNTER — Ambulatory Visit (HOSPITAL_COMMUNITY): Payer: BC Managed Care – PPO | Admitting: Anesthesiology

## 2022-06-03 ENCOUNTER — Encounter (HOSPITAL_COMMUNITY)
Admission: RE | Disposition: A | Discharge status: Home / Self Care | Payer: Self-pay | Source: Home / Self Care | Attending: Cardiology

## 2022-06-03 ENCOUNTER — Encounter (HOSPITAL_COMMUNITY): Payer: Self-pay | Admitting: Cardiology

## 2022-06-03 ENCOUNTER — Other Ambulatory Visit (HOSPITAL_COMMUNITY): Payer: Self-pay

## 2022-06-03 DIAGNOSIS — Z87891 Personal history of nicotine dependence: Secondary | ICD-10-CM | POA: Diagnosis not present

## 2022-06-03 DIAGNOSIS — K219 Gastro-esophageal reflux disease without esophagitis: Secondary | ICD-10-CM | POA: Insufficient documentation

## 2022-06-03 DIAGNOSIS — J189 Pneumonia, unspecified organism: Secondary | ICD-10-CM | POA: Diagnosis not present

## 2022-06-03 DIAGNOSIS — I4891 Unspecified atrial fibrillation: Secondary | ICD-10-CM | POA: Diagnosis not present

## 2022-06-03 DIAGNOSIS — Z8673 Personal history of transient ischemic attack (TIA), and cerebral infarction without residual deficits: Secondary | ICD-10-CM | POA: Insufficient documentation

## 2022-06-03 DIAGNOSIS — Z8616 Personal history of COVID-19: Secondary | ICD-10-CM | POA: Insufficient documentation

## 2022-06-03 DIAGNOSIS — I480 Paroxysmal atrial fibrillation: Secondary | ICD-10-CM | POA: Insufficient documentation

## 2022-06-03 DIAGNOSIS — F419 Anxiety disorder, unspecified: Secondary | ICD-10-CM | POA: Diagnosis not present

## 2022-06-03 HISTORY — PX: ATRIAL FIBRILLATION ABLATION: EP1191

## 2022-06-03 LAB — POCT ACTIVATED CLOTTING TIME
Activated Clotting Time: 317 seconds
Activated Clotting Time: 335 seconds

## 2022-06-03 SURGERY — ATRIAL FIBRILLATION ABLATION
Anesthesia: General

## 2022-06-03 MED ORDER — SODIUM CHLORIDE 0.9% FLUSH: 3.0000 mL | INTRAVENOUS | Status: DC | PRN

## 2022-06-03 MED ORDER — SODIUM CHLORIDE 0.9 % IV SOLN: INTRAVENOUS | Status: DC

## 2022-06-03 MED ORDER — APIXABAN 5 MG PO TABS
5.0000 mg | ORAL_TABLET | Freq: Two times a day (BID) | ORAL | Status: DC
  Administered 2022-06-03: 5 mg via ORAL
  Filled 2022-06-03: qty 1

## 2022-06-03 MED ORDER — COLCHICINE 0.6 MG PO TABS
0.6000 mg | ORAL_TABLET | Freq: Two times a day (BID) | ORAL | Status: DC
  Administered 2022-06-03: 0.6 mg via ORAL
  Filled 2022-06-03: qty 1

## 2022-06-03 MED ORDER — SODIUM CHLORIDE 0.9% FLUSH: 3.0000 mL | Freq: Two times a day (BID) | INTRAVENOUS | Status: DC

## 2022-06-03 MED ORDER — PANTOPRAZOLE SODIUM 40 MG PO TBEC
40.0000 mg | DELAYED_RELEASE_TABLET | Freq: Every day | ORAL | 0 refills | Status: DC
  Filled 2022-06-03: qty 45, 45d supply, fill #0

## 2022-06-03 MED ORDER — HEPARIN (PORCINE) IN NACL 1000-0.9 UT/500ML-% IV SOLN
INTRAVENOUS | Status: AC
  Filled 2022-06-03: qty 500

## 2022-06-03 MED ORDER — PANTOPRAZOLE SODIUM 40 MG PO TBEC
40.0000 mg | DELAYED_RELEASE_TABLET | Freq: Every day | ORAL | Status: DC
  Administered 2022-06-03: 40 mg via ORAL
  Filled 2022-06-03: qty 1

## 2022-06-03 MED ORDER — ONDANSETRON HCL 4 MG/2ML IJ SOLN: 4.0000 mg | Freq: Four times a day (QID) | INTRAMUSCULAR | Status: DC | PRN

## 2022-06-03 MED ORDER — HEPARIN SODIUM (PORCINE) 1000 UNIT/ML IJ SOLN
INTRAMUSCULAR | Status: DC | PRN
  Administered 2022-06-03: 1000 [IU] via INTRAVENOUS

## 2022-06-03 MED ORDER — HEPARIN SODIUM (PORCINE) 1000 UNIT/ML IJ SOLN
INTRAMUSCULAR | Status: AC
  Filled 2022-06-03: qty 10

## 2022-06-03 MED ORDER — PROPOFOL 10 MG/ML IV BOLUS
INTRAVENOUS | Status: DC | PRN
  Administered 2022-06-03: 120 mg via INTRAVENOUS

## 2022-06-03 MED ORDER — SODIUM CHLORIDE 0.9 % IV SOLN: 250.0000 mL | INTRAVENOUS | Status: DC | PRN

## 2022-06-03 MED ORDER — ONDANSETRON HCL 4 MG/2ML IJ SOLN
INTRAMUSCULAR | Status: DC | PRN
  Administered 2022-06-03: 4 mg via INTRAVENOUS

## 2022-06-03 MED ORDER — ACETAMINOPHEN 325 MG PO TABS: 650.0000 mg | ORAL_TABLET | ORAL | Status: DC | PRN

## 2022-06-03 MED ORDER — FENTANYL CITRATE (PF) 250 MCG/5ML IJ SOLN
INTRAMUSCULAR | Status: DC | PRN
  Administered 2022-06-03 (×2): 50 ug via INTRAVENOUS

## 2022-06-03 MED ORDER — DEXAMETHASONE SODIUM PHOSPHATE 10 MG/ML IJ SOLN
INTRAMUSCULAR | Status: DC | PRN
  Administered 2022-06-03: 10 mg via INTRAVENOUS

## 2022-06-03 MED ORDER — COLCHICINE 0.6 MG PO TABS
0.6000 mg | ORAL_TABLET | Freq: Two times a day (BID) | ORAL | 0 refills | Status: DC
  Filled 2022-06-03: qty 10, 5d supply, fill #0

## 2022-06-03 MED ORDER — SUGAMMADEX SODIUM 200 MG/2ML IV SOLN
INTRAVENOUS | Status: DC | PRN
  Administered 2022-06-03: 200 mg via INTRAVENOUS

## 2022-06-03 MED ORDER — LIDOCAINE 2% (20 MG/ML) 5 ML SYRINGE
INTRAMUSCULAR | Status: DC | PRN
  Administered 2022-06-03: 80 mg via INTRAVENOUS

## 2022-06-03 MED ORDER — HEPARIN SODIUM (PORCINE) 1000 UNIT/ML IJ SOLN
INTRAMUSCULAR | Status: DC | PRN
  Administered 2022-06-03: 3000 [IU] via INTRAVENOUS
  Administered 2022-06-03: 13000 [IU] via INTRAVENOUS

## 2022-06-03 MED ORDER — PROTAMINE SULFATE 10 MG/ML IV SOLN
INTRAVENOUS | Status: DC | PRN
  Administered 2022-06-03 (×3): 10 mg via INTRAVENOUS

## 2022-06-03 MED ORDER — HEPARIN (PORCINE) IN NACL 1000-0.9 UT/500ML-% IV SOLN
INTRAVENOUS | Status: DC | PRN
  Administered 2022-06-03 (×3): 500 mL

## 2022-06-03 MED ORDER — MIDAZOLAM HCL 2 MG/2ML IJ SOLN
INTRAMUSCULAR | Status: DC | PRN
  Administered 2022-06-03: 2 mg via INTRAVENOUS

## 2022-06-03 MED ORDER — ROCURONIUM BROMIDE 10 MG/ML (PF) SYRINGE
PREFILLED_SYRINGE | INTRAVENOUS | Status: DC | PRN
  Administered 2022-06-03: 20 mg via INTRAVENOUS
  Administered 2022-06-03: 60 mg via INTRAVENOUS
  Administered 2022-06-03: 20 mg via INTRAVENOUS

## 2022-06-03 SURGICAL SUPPLY — 17 items
CATH 8FR REPROCESSED SOUNDSTAR (CATHETERS) ×1 IMPLANT
CATH 8FR SOUNDSTAR REPROCESSED (CATHETERS) IMPLANT
CATH ABLAT QDOT MICRO BI TC DF (CATHETERS) IMPLANT
CATH OCTARAY 2.0 F 3-3-3-3-3 (CATHETERS) IMPLANT
CATH S-M CIRCA TEMP PROBE (CATHETERS) IMPLANT
CATH WEB BI DIR CSDF CRV REPRO (CATHETERS) IMPLANT
CLOSURE PERCLOSE PROSTYLE (VASCULAR PRODUCTS) IMPLANT
COVER SWIFTLINK CONNECTOR (BAG) ×1 IMPLANT
PACK EP LATEX FREE (CUSTOM PROCEDURE TRAY) ×1
PACK EP LF (CUSTOM PROCEDURE TRAY) ×1 IMPLANT
PAD DEFIB RADIO PHYSIO CONN (PAD) ×1 IMPLANT
SHEATH BAYLIS TRANSSEPTAL 98CM (NEEDLE) IMPLANT
SHEATH CARTO VIZIGO SM CVD (SHEATH) IMPLANT
SHEATH PINNACLE 8F 10CM (SHEATH) IMPLANT
SHEATH PINNACLE 9F 10CM (SHEATH) IMPLANT
SHEATH PROBE COVER 6X72 (BAG) IMPLANT
TUBING SMART ABLATE COOLFLOW (TUBING) IMPLANT

## 2022-06-03 NOTE — Anesthesia Postprocedure Evaluation (Signed)
Anesthesia Post Note  Patient: Kim Irwin  Procedure(s) Performed: ATRIAL FIBRILLATION ABLATION     Patient location during evaluation: PACU Anesthesia Type: General Level of consciousness: sedated and patient cooperative Pain management: pain level controlled Vital Signs Assessment: post-procedure vital signs reviewed and stable Respiratory status: spontaneous breathing Cardiovascular status: stable Anesthetic complications: no   There were no known notable events for this encounter.  Last Vitals:  Vitals:   06/03/22 1230 06/03/22 1300  BP: 109/69 112/77  Pulse: 88 91  Resp: 14 19  Temp:    SpO2: 99% 100%    Last Pain:  Vitals:   06/03/22 1030  TempSrc:   PainSc: 0-No pain                 Nolon Nations

## 2022-06-03 NOTE — Anesthesia Procedure Notes (Signed)
Procedure Name: Intubation Date/Time: 06/03/2022 7:47 AM  Performed by: Griffin Dakin, CRNAPre-anesthesia Checklist: Patient identified, Emergency Drugs available, Suction available and Patient being monitored Patient Re-evaluated:Patient Re-evaluated prior to induction Oxygen Delivery Method: Circle system utilized Preoxygenation: Pre-oxygenation with 100% oxygen Induction Type: IV induction Ventilation: Mask ventilation without difficulty Laryngoscope Size: Mac and 3 Grade View: Grade II Tube type: Oral Tube size: 7.0 mm Number of attempts: 1 Airway Equipment and Method: Stylet Placement Confirmation: ETT inserted through vocal cords under direct vision, positive ETCO2 and breath sounds checked- equal and bilateral Secured at: 22 cm Tube secured with: Tape Dental Injury: Teeth and Oropharynx as per pre-operative assessment

## 2022-06-03 NOTE — Discharge Instructions (Signed)

## 2022-06-03 NOTE — Transfer of Care (Signed)
Immediate Anesthesia Transfer of Care Note  Patient: Kim Irwin  Procedure(s) Performed: ATRIAL FIBRILLATION ABLATION  Patient Location: Cath Lab  Anesthesia Type:General  Level of Consciousness: awake, alert , and oriented  Airway & Oxygen Therapy: Patient Spontanous Breathing  Post-op Assessment: Report given to RN and Post -op Vital signs reviewed and stable  Post vital signs: Reviewed and stable  Last Vitals:  Vitals Value Taken Time  BP 114/87 06/03/22 0945  Temp    Pulse 77 06/03/22 0947  Resp 15 06/03/22 0947  SpO2 100 % 06/03/22 0947  Vitals shown include unvalidated device data.  Last Pain:  Vitals:   06/03/22 0947  TempSrc:   PainSc: 0-No pain         Complications: There were no known notable events for this encounter.

## 2022-06-03 NOTE — H&P (Signed)
Electrophysiology Office Note:     Date:  06/03/2022    ID:  Kim Irwin, DOB Jun 26, 1966, MRN [id number]   PCP:  Everardo Beals, NP         [facility] Cardiologist:  Donato Heinz, MD  [place] Electrophysiologist:  None    Referring MD: Donato Heinz*    Chief Complaint: New patient consult for ablation and watchman   History of Present Illness:     Kim Irwin is a 56 y.o. female who presents for an evaluation for ablation and watchman procedures at the request of Dr. Gardiner Rhyme. Their medical history includes paroxysmal atrial fibrillation, pneumonia, GERD, and arthritis.   Initially presented to [facility] 09/05/20 with new onset atrial fibrillation with RVR after taking phentermine and tramadol. She was also COVID-19 positive. In the ED she failed DCCV x2, but converted to NSR on IV amiodarone and diltiazem. Her CHADS2VASC score was 1, she declined anticoagulation. On 06/2021 she was admitted with concerns for TIA indicated by right-sided weakness. She was started on Eliquis.   She saw Dr. Gardiner Rhyme on 11/18/2021 where she was in Afib at 86 bpm per her EKG. She had stopped taking diltiazem due to developing a rash. She also stopped taking Eliquis due to reported intolerance and sensitivity to medications. It was noted she was drinking 2 glasses of wine per night. She was amenable to trying Xarelto, and she was referred to EP for discussion of possible ablation and/or watchman.   Today, she confirms being able to feel "weird" palpitations and some shortness of breath when she is in Afib. In the past few days she has not noticed any episodes. Typically she may have 2 arrhythmic episodes in a week. She also notes that stress is a known trigger of her Afib.    She never started the Xarelto, and she is not currently taking metoprolol.   She denies any chest pain, shortness of breath, or peripheral edema. No lightheadedness, headaches, syncope, orthopnea, or  PND.    Presents today for pVI.    Objective      Past Medical History:  Diagnosis Date   Allergy      macrobid   Anxiety     Arthritis      hands   GERD (gastroesophageal reflux disease)     Hearing loss      slight, no hearing aids   Migraine      otc med prn   Paroxysmal A-fib (HCC)     Pneumonia      hx - resolved   SVD (spontaneous vaginal delivery)      x 1   Urinary tract infection      Hx - resolved           Past Surgical History:  Procedure Laterality Date   COLONOSCOPY   09/2003    Normal - [facility]   endometrial ablation       TUBAL LIGATION       WRIST SURGERY Right        Current Medications: Active Medications      Current Meds  Medication Sig   [START ON 03/24/2022] apixaban (ELIQUIS) 5 MG TABS tablet Take 1 tablet (5 mg total) by mouth 2 (two) times daily.   Ascorbic Acid (VITAMIN C) 500 MG CAPS Take by mouth.   aspirin EC 81 MG tablet Take 81 mg by mouth daily. Swallow whole.   Calcium Carb-Magnesium Carb (MAGNEBIND 400 PO) Take 1 tablet by mouth  daily.   Coenzyme Q10 (COQ10) 200 MG CAPS Take 1 capsule by mouth daily.   famotidine (PEPCID) 20 MG tablet Take 20 mg by mouth as needed for heartburn or indigestion.   loratadine (CLARITIN) 10 MG tablet Take 10 mg by mouth daily.   Spirulina 500 MG TABS Take by mouth.   Tetrahydrozoline HCl (VISINE OP) Place 1 drop into both eyes daily as needed (dry eyes).        Allergies:   Macrobid [nitrofurantoin] and Diltiazem    Social History         Socioeconomic History   Marital status: Married      Spouse name: Not on file   Number of children: Not on file   Years of education: Not on file   Highest education level: Not on file  Occupational History   Not on file  Tobacco Use   Smoking status: Former      Packs/day: 0.20      Years: 15.00      Total pack years: 3.00      Types: Cigarettes      Quit date: 07/18/1996      Years since quitting: 25.4   Smokeless tobacco: Never  Substance and  Sexual Activity   Alcohol use: Yes      Alcohol/week: 21.0 standard drinks of alcohol      Types: 21 Glasses of wine per week      Comment: 3 glasses of wine daily   Drug use: Yes      Comment: Phentermine, Tramadol   Sexual activity: Yes      Birth control/protection: Surgical  Other Topics Concern   Not on file  Social History Narrative   Not on file    Social Determinants of Health    Financial Resource Strain: Not on file  Food Insecurity: Not on file  Transportation Needs: Not on file  Physical Activity: Not on file  Stress: Not on file  Social Connections: Not on file      Family History: The patient's family history includes Asthma in her father; Heart disease in her mother; Hyperlipidemia in her father; Other in her mother; Stomach cancer in her paternal grandfather. There is no history of Colon cancer, Esophageal cancer, Rectal cancer, or Breast cancer.   ROS:   Please see the history of present illness.    (+) Palpitations (+) Stress All other systems reviewed and are negative.   EKGs/Labs/Other Studies Reviewed:     The following studies were reviewed today:   07/2021  Monitor: 11% atrial fibrillation burden, longest episode lasting 19 hours. Average heart rate in A. fib was 97 bpm     Patch Wear Time:  14 days and 0 hours (2022-12-11T06:26:11-498 to 2022-12-25T06:26:03-499)   Patient had a min HR of 46 bpm, max HR of 203 bpm, and avg HR of 72 bpm. Predominant underlying rhythm was Sinus Rhythm. 1 run of Supraventricular Tachycardia occurred lasting 4 beats with a max rate of 124 bpm (avg 106 bpm). Atrial Fibrillation occurred  (11% burden), ranging from 53-203 bpm (avg of 97 bpm), the longest lasting 19 hours 16 mins with an avg rate of 94 bpm. Atrial Fibrillation was detected within +/- 45 seconds of symptomatic patient event(s). Isolated SVEs were rare (<1.0%), SVE Couplets  were rare (<1.0%), and SVE Triplets were rare (<1.0%). Isolated VEs were rare  (<1.0%), VE Couplets were rare (<1.0%), and no VE Triplets were present.  15 patient triggered events, corresponding to atrial fibrillation  in sinus rhythm  PACs   06/21/21  Echo:  1. Left ventricular ejection fraction, by estimation, is 55 to 60%. The  left ventricle has normal function. The left ventricle has no regional  wall motion abnormalities. Left ventricular diastolic parameters were  normal.   2. Right ventricular systolic function is normal. The right ventricular  size is normal. Tricuspid regurgitation signal is inadequate for assessing  PA pressure.   3. The mitral valve is normal in structure. Mild mitral valve  regurgitation. No evidence of mitral stenosis.   4. The aortic valve is tricuspid. Aortic valve regurgitation is not  visualized. Aortic valve sclerosis is present, with no evidence of aortic  valve stenosis.   5. The inferior vena cava is normal in size with greater than 50%  respiratory variability, suggesting right atrial pressure of 3 mmHg.   6. Agitated saline contrast bubble study was negative, with no evidence  of any interatrial shunt. Technically difficult bubble study, but no  shunting seen.      EKG:   EKG is personally reviewed.  01/04/2022:  EKG was not ordered.     Recent Labs: 06/20/2021: ALT 21; Hemoglobin 15.0; Platelets 276 06/21/2021: BUN 21; Creatinine, Ser 0.82; Magnesium 2.1; Potassium 4.8; Sodium 136; TSH 2.557    Recent Lipid Panel Labs (Brief)          Component Value Date/Time    CHOL 178 06/21/2021 0818    TRIG 68 06/21/2021 0818    HDL 69 06/21/2021 0818    CHOLHDL 2.6 06/21/2021 0818    VLDL 14 06/21/2021 0818    LDLCALC 95 06/21/2021 0818        Physical Exam:     VS:  BP 120/84   Pulse 78   Ht '5\' 8"'$  (1.727 m)   Wt 171 lb (77.6 kg)   SpO2 94%   BMI 26.00 kg/m         Wt Readings from Last 3 Encounters:  01/04/22 171 lb (77.6 kg)  11/18/21 170 lb (77.1 kg)  08/17/21 169 lb (76.7 kg)      GEN: Well nourished,  well developed in no acute distress HEENT: Normal NECK: No JVD; No carotid bruits LYMPHATICS: No lymphadenopathy CARDIAC: RRR, no murmurs, rubs, gallops RESPIRATORY:  Clear to auscultation without rales, wheezing or rhonchi  ABDOMEN: Soft, non-tender, non-distended MUSCULOSKELETAL:  No edema; No deformity  SKIN: Warm and dry NEUROLOGIC:  Alert and oriented x 3 PSYCHIATRIC:  Normal affect          Assessment ASSESSMENT:     1. Paroxysmal atrial fibrillation (HCC)     PLAN:     In order of problems listed above:   #Paroxysmal atrial fibrillation Symptomatic.  Not currently taking an anticoagulant given concerns of off target effects.  I discussed treatment options for her atrial fibrillation including antiarrhythmic drugs and catheter ablation.  The patient prefers a strategy that avoids long-term exposure to any medications.  She would like to proceed with catheter ablation.  I discussed the procedure in detail with the patient including the risks, recovery and likelihood of success.  I discussed the need for possible second ablation procedures.  Discussed treatment options today for their AF including antiarrhythmic drug therapy and ablation. Discussed risks, recovery and likelihood of success. Discussed potential need for repeat ablation procedures and antiarrhythmic drugs after an initial ablation. They wish to proceed with scheduling.  Risk, benefits, and alternatives to EP study and radiofrequency ablation for afib were also discussed  in detail today. These risks include but are not limited to stroke, bleeding, vascular damage, tamponade, perforation, damage to the esophagus, lungs, and other structures, pulmonary vein stenosis, worsening renal function, and death. The patient understands these risk and wishes to proceed.  We will therefore proceed with catheter ablation at the next available time.  Carto, ICE, anesthesia are requested for the procedure.      Signed, Hilton Cork.  Quentin Ore, MD, [facility], [facility] 06/03/2022 Electrophysiology [facility]

## 2022-07-05 ENCOUNTER — Ambulatory Visit (HOSPITAL_COMMUNITY): Payer: BC Managed Care – PPO | Admitting: Nurse Practitioner

## 2022-07-07 ENCOUNTER — Encounter (HOSPITAL_COMMUNITY): Payer: Self-pay | Admitting: Nurse Practitioner

## 2022-07-07 ENCOUNTER — Ambulatory Visit (HOSPITAL_COMMUNITY)
Admission: RE | Admit: 2022-07-07 | Discharge: 2022-07-07 | Disposition: A | Discharge status: Home / Self Care | Payer: BC Managed Care – PPO | Source: Ambulatory Visit | Attending: Nurse Practitioner | Admitting: Nurse Practitioner

## 2022-07-07 VITALS — BP 132/84 | HR 74 | Ht 68.0 in | Wt 177.4 lb

## 2022-07-07 DIAGNOSIS — I480 Paroxysmal atrial fibrillation: Secondary | ICD-10-CM

## 2022-07-07 DIAGNOSIS — I4891 Unspecified atrial fibrillation: Secondary | ICD-10-CM | POA: Insufficient documentation

## 2022-07-07 DIAGNOSIS — Z7901 Long term (current) use of anticoagulants: Secondary | ICD-10-CM | POA: Diagnosis not present

## 2022-07-07 DIAGNOSIS — D6869 Other thrombophilia: Secondary | ICD-10-CM | POA: Diagnosis not present

## 2022-07-07 NOTE — Progress Notes (Signed)
Primary Care Physician: Everardo Beals, NP Referring Physician:Dr. Lindsi Bayliss is a 56 y.o. female with a h/o  afib s/p ablation 06/03/22. He is doing well with no sustained afib. Will feel a few palpitations from  time to time. No swallowing or groin issues. Is back  to her usual activities.   Today, she denies symptoms of palpitations, chest pain, shortness of breath, orthopnea, PND, lower extremity edema, dizziness, presyncope, syncope, or neurologic sequela. The patient is tolerating medications without difficulties and is otherwise without complaint today.   Past Medical History:  Diagnosis Date   Allergy    macrobid   Anxiety    Arthritis    hands   GERD (gastroesophageal reflux disease)    Hearing loss    slight, no hearing aids   Migraine    otc med prn   Paroxysmal A-fib (HCC)    Pneumonia    hx - resolved   SVD (spontaneous vaginal delivery)    x 1   Urinary tract infection    Hx - resolved   Past Surgical History:  Procedure Laterality Date   ATRIAL FIBRILLATION ABLATION N/A 06/03/2022   Procedure: ATRIAL FIBRILLATION ABLATION;  Surgeon: Vickie Epley, MD;  Location: [place] [facility];  Service: Cardiovascular;  Laterality: N/A;   COLONOSCOPY  09/2003   Normal - [facility]   endometrial ablation     TUBAL LIGATION     WRIST SURGERY Right     Current Outpatient Medications  Medication Sig Dispense Refill   apixaban (ELIQUIS) 5 MG TABS tablet Take 1 tablet (5 mg total) by mouth 2 (two) times daily. 60 tablet 4   magnesium oxide (MAG-OX) 400 (240 Mg) MG tablet Take 400 mg by mouth daily.     pantoprazole (PROTONIX) 40 MG tablet Take 1 tablet (40 mg total) by mouth daily. 45 tablet 0   Probiotic Product (PROBIOTIC PO) Take 1 capsule by mouth in the morning.     tetrahydrozoline-zinc (VISINE-AC) 0.05-0.25 % ophthalmic solution Place 2 drops into both eyes 3 (three) times daily as needed (dry eyes).     colchicine 0.6 MG tablet Take 1 tablet  (0.6 mg total) by mouth 2 (two) times daily for 5 days. 10 tablet 0   No current facility-administered medications for this encounter.    Allergies  Allergen Reactions   Macrobid [Nitrofurantoin] Other (See Comments)    Flu like symptoms   Diltiazem Rash    Social History   Socioeconomic History   Marital status: Married    Spouse name: Not on file   Number of children: Not on file   Years of education: Not on file   Highest education level: Not on file  Occupational History   Not on file  Tobacco Use   Smoking status: Former    Packs/day: 0.20    Years: 15.00    Total pack years: 3.00    Types: Cigarettes    Quit date: 07/18/1996    Years since quitting: 25.9   Smokeless tobacco: Never   Tobacco comments:    Former smoker 07/07/22  Substance and Sexual Activity   Alcohol use: Yes    Alcohol/week: 21.0 standard drinks of alcohol    Types: 21 Glasses of wine per week    Comment: 3 glasses of wine daily 07/07/22   Drug use: Not Currently    Comment: Phentermine, Tramadol   Sexual activity: Yes    Birth control/protection: Surgical  Other Topics  Concern   Not on file  Social History Narrative   Not on file   Social Determinants of Health   Financial Resource Strain: Not on file  Food Insecurity: Not on file  Transportation Needs: Not on file  Physical Activity: Not on file  Stress: Not on file  Social Connections: Not on file  Intimate Partner Violence: Not on file    Family History  Problem Relation Age of Onset   Hyperlipidemia Father    Asthma Father    Other Mother    Heart disease Mother    Stomach cancer Paternal Grandfather    Colon cancer Neg Hx    Esophageal cancer Neg Hx    Rectal cancer Neg Hx    Breast cancer Neg Hx     ROS- All systems are reviewed and negative except as per the HPI above  Physical Exam: Vitals:   07/07/22 1136  BP: 132/84  Pulse: 74  Weight: 80.5 kg  Height: '5\' 8"'$  (1.727 m)   Wt Readings from Last 3 Encounters:   07/07/22 80.5 kg  06/03/22 79.4 kg  01/04/22 77.6 kg    Labs: Lab Results  Component Value Date   NA 137 05/20/2022   K 4.4 05/20/2022   CL 102 05/20/2022   CO2 22 05/20/2022   GLUCOSE 69 (L) 05/20/2022   BUN 16 05/20/2022   CREATININE 0.83 05/20/2022   CALCIUM 9.0 05/20/2022   MG 2.1 06/21/2021   Lab Results  Component Value Date   INR 1.0 06/20/2021   Lab Results  Component Value Date   CHOL 178 06/21/2021   HDL 69 06/21/2021   LDLCALC 95 06/21/2021   TRIG 68 06/21/2021     GEN- The patient is well appearing, alert and oriented x 3 today.   Head- normocephalic, atraumatic Eyes-  Sclera clear, conjunctiva pink Ears- hearing intact Oropharynx- clear Neck- supple, no JVP Lymph- no cervical lymphadenopathy Lungs- Clear to ausculation bilaterally, normal work of breathing Heart- Regular rate and rhythm, no murmurs, rubs or gallops, PMI not laterally displaced GI- soft, NT, ND, + BS Extremities- no clubbing, cyanosis, or edema MS- no significant deformity or atrophy Skin- no rash or lesion Psych- euthymic mood, full affect Neuro- strength and sensation are intact  EKG-Vent. rate 74 BPM PR interval 160 ms QRS duration 80 ms QT/QTcB 370/410 ms P-R-T axes -2 56 32 Sinus rhythm with Premature atrial complexes Otherwise normal ECG When compared with ECG of 03-Jun-2022 10:07, PREVIOUS ECG IS PRESENT Confirmed by Adrian Prows ([id number]) on 07/07/2022 12:29:30 PM    Assessment and Plan:  1. Afib S/p afib ablation Doing  well staying in SR  2. CHA2DS2VASc  score of 3 Continue eliquis 5 mg bid   F/u with Dr. Quentin Ore as scheduled 09/07/21  Geroge Baseman. Talaya Lamprecht, [facility] [address] [place], [place] [address] [phone]

## 2022-08-25 ENCOUNTER — Encounter (HOSPITAL_COMMUNITY): Payer: Self-pay | Admitting: *Deleted

## 2022-09-06 NOTE — Progress Notes (Unsigned)
Electrophysiology Office Follow up Visit Note:    Date:  09/07/2022   ID:  Kim Irwin, DOB Apr 16, 1966, MRN [id number]  PCP:  Everardo Beals, NP  [facility] Cardiologist:  Donato Heinz, MD  [place] Electrophysiologist:  Vickie Epley, MD    Interval History:    Kim Irwin is a 57 y.o. female who presents for a follow up visit.  She had an A-fib ablation on June 03, 2022.  During the ablation the veins and posterior wall were isolated.  She saw Butch Penny in follow-up July 07, 2022.  At that appointment she was maintaining sinus rhythm.  She is on Eliquis for an elevated CHA2DS2-VASc.  Today she is in clinic by herself.  She is tearful during the visit.  She tells me that before the ablation she was having frequent sustained episodes of atrial fibrillation.  She has not had any sustained episodes of atrial fibrillation after the catheter ablation.  Every once in a while she will feel her heart skip beats.  This tends to happen every other day.  This is clearly different than her A-fib symptoms prior to the ablation.  She tells me has intermittent trouble swallowing.  This is localized to the back of her throat. It is not in her esophagus but more in the back of her throat.  It is not a pain.  She describes it as the muscles forgetting how to swallow.  She is most interested in getting off of her medications.  She does not want to take Eliquis or any other blood thinner.  She has read a lot of material online about these medications.  She is only been taking 2.5 mg twice daily of Eliquis.     Past Medical History:  Diagnosis Date   Allergy    macrobid   Anxiety    Arthritis    hands   GERD (gastroesophageal reflux disease)    Hearing loss    slight, no hearing aids   Migraine    otc med prn   Paroxysmal A-fib (HCC)    Pneumonia    hx - resolved   SVD (spontaneous vaginal delivery)    x 1   Urinary tract infection    Hx - resolved     Past Surgical History:  Procedure Laterality Date   ATRIAL FIBRILLATION ABLATION N/A 06/03/2022   Procedure: ATRIAL FIBRILLATION ABLATION;  Surgeon: Vickie Epley, MD;  Location: [facility];  Service: Cardiovascular;  Laterality: N/A;   COLONOSCOPY  09/2003   Normal - [facility]   endometrial ablation     TUBAL LIGATION     WRIST SURGERY Right     Current Medications: Current Meds  Medication Sig   aspirin EC 81 MG tablet Take 1 tablet (81 mg total) by mouth daily. Swallow whole.   magnesium oxide (MAG-OX) 400 (240 Mg) MG tablet Take 400 mg by mouth daily.   Probiotic Product (PROBIOTIC PO) Take 1 capsule by mouth in the morning.   tetrahydrozoline-zinc (VISINE-AC) 0.05-0.25 % ophthalmic solution Place 2 drops into both eyes 3 (three) times daily as needed (dry eyes).   [DISCONTINUED] apixaban (ELIQUIS) 5 MG TABS tablet Take 1 tablet (5 mg total) by mouth 2 (two) times daily.     Allergies:   Macrobid [nitrofurantoin] and Diltiazem   Social History   Socioeconomic History   Marital status: Married    Spouse name: Not on file   Number of children: Not on file  Years of education: Not on file   Highest education level: Not on file  Occupational History   Not on file  Tobacco Use   Smoking status: Former    Packs/day: 0.20    Years: 15.00    Total pack years: 3.00    Types: Cigarettes    Quit date: 07/18/1996    Years since quitting: 26.1   Smokeless tobacco: Never   Tobacco comments:    Former smoker 07/07/22  Substance and Sexual Activity   Alcohol use: Yes    Alcohol/week: 21.0 standard drinks of alcohol    Types: 21 Glasses of wine per week    Comment: 3 glasses of wine daily 07/07/22   Drug use: Not Currently    Comment: Phentermine, Tramadol   Sexual activity: Yes    Birth control/protection: Surgical  Other Topics Concern   Not on file  Social History Narrative   Not on file   Social Determinants of Health   Financial Resource Strain: Not on  file  Food Insecurity: Not on file  Transportation Needs: Not on file  Physical Activity: Not on file  Stress: Not on file  Social Connections: Not on file     Family History: The patient's family history includes Asthma in her father; Heart disease in her mother; Hyperlipidemia in her father; Other in her mother; Stomach cancer in her paternal grandfather. There is no history of Colon cancer, Esophageal cancer, Rectal cancer, or Breast cancer.  ROS:   Please see the history of present illness.    All other systems reviewed and are negative.  EKGs/Labs/Other Studies Reviewed:    The following studies were reviewed today:     Recent Labs: 05/20/2022: BUN 16; Creatinine, Ser 0.83; Hemoglobin 13.5; Platelets 270; Potassium 4.4; Sodium 137  Recent Lipid Panel    Component Value Date/Time   CHOL 178 06/21/2021 0818   TRIG 68 06/21/2021 0818   HDL 69 06/21/2021 0818   CHOLHDL 2.6 06/21/2021 0818   VLDL 14 06/21/2021 0818   LDLCALC 95 06/21/2021 0818    Physical Exam:    VS:  BP (!) 146/82   Pulse 78   Ht 5' 8"$  (1.727 m)   Wt 180 lb (81.6 kg)   SpO2 97%   BMI 27.37 kg/m     Wt Readings from Last 3 Encounters:  09/07/22 180 lb (81.6 kg)  07/07/22 177 lb 6.4 oz (80.5 kg)  06/03/22 175 lb (79.4 kg)     GEN:  Well nourished, well developed.  Tearful. CARDIAC: RRR, no murmurs, rubs, gallops       ASSESSMENT:    1. Paroxysmal atrial fibrillation (HCC)   2. TIA (transient ischemic attack)    PLAN:    In order of problems listed above:   #Paroxysmal atrial fibrillation Maintaining sinus rhythm after her November ablation.  She reports no sustained episodes of atrial fibrillation after the ablation.  Had a long discussion today about anticoagulation and stroke risk associated with her atrial fibrillation especially given her history of a TIA.  I recommended taking Eliquis 5 mg by mouth twice daily which would be the appropriate dose for her anticoagulant but she  is very clear that she has no desire to continue anticoagulant.  She would like to stop her medications.  After long discussion she is at least agreeable to taking an aspirin 81 mg by mouth once daily.  I would be in favor of this over no antiplatelet/anticoagulant.  We did discuss the increased  risk of stroke being off of medications and she expressed understanding.  I will have her wear a 7-day ZIO monitor to assess her rhythm.  #Swallowing trouble Recent development.  Did not occur acutely after the ablation.  Given the timeline and where she describes the sensation (back of throat) I wonder whether or not this could represent reflux.  I recommended that she restart her Protonix 40 mg by mouth once daily.  #History of TIA No recurrence Again, discussed anticoagulation as above but the patient declines and would prefer to take aspirin 81 mg by mouth once daily.  Follow-up 6 months with APP.   Medication Adjustments/Labs and Tests Ordered: Current medicines are reviewed at length with the patient today.  Concerns regarding medicines are outlined above.  Orders Placed This Encounter  Procedures   LONG TERM MONITOR (3-14 DAYS)   Meds ordered this encounter  Medications   pantoprazole (PROTONIX) 40 MG tablet    Sig: Take 1 tablet (40 mg total) by mouth daily.    Dispense:  90 tablet    Refill:  1   aspirin EC 81 MG tablet    Sig: Take 1 tablet (81 mg total) by mouth daily. Swallow whole.    Dispense:  90 tablet    Refill:  3     Signed, Lars Mage, MD, [facility], [facility] 09/07/2022 1:52 PM    Electrophysiology [facility]

## 2022-09-07 ENCOUNTER — Ambulatory Visit (INDEPENDENT_AMBULATORY_CARE_PROVIDER_SITE_OTHER): Payer: BC Managed Care – PPO

## 2022-09-07 ENCOUNTER — Encounter: Payer: Self-pay | Admitting: Cardiology

## 2022-09-07 ENCOUNTER — Ambulatory Visit: Payer: BC Managed Care – PPO | Attending: Cardiology | Admitting: Cardiology

## 2022-09-07 VITALS — BP 146/82 | HR 78 | Ht 68.0 in | Wt 180.0 lb

## 2022-09-07 DIAGNOSIS — G459 Transient cerebral ischemic attack, unspecified: Secondary | ICD-10-CM

## 2022-09-07 DIAGNOSIS — I480 Paroxysmal atrial fibrillation: Secondary | ICD-10-CM

## 2022-09-07 MED ORDER — ASPIRIN 81 MG PO TBEC: 81.0000 mg | DELAYED_RELEASE_TABLET | Freq: Every day | ORAL | 3 refills | Status: DC

## 2022-09-07 MED ORDER — PANTOPRAZOLE SODIUM 40 MG PO TBEC: 40.0000 mg | DELAYED_RELEASE_TABLET | Freq: Every day | ORAL | 1 refills | Status: AC

## 2022-09-07 NOTE — Patient Instructions (Signed)
Medication Instructions:  Your physician has recommended you make the following change in your medication:  1) STOP taking Eliquis  2) START taking Protonix 40 mg daily  3) START taking Aspirin 81 mg daily  *If you need a refill on your cardiac medications before your next appointment, please call your pharmacy*  Testing/Procedures: Your physician has recommended that you wear an event monitor. Event monitors are medical devices that record the heart's electrical activity. Doctors most often [place] these monitors to diagnose arrhythmias. Arrhythmias are problems with the speed or rhythm of the heartbeat. The monitor is a small, portable device. You can wear one while you do your normal daily activities. This is usually used to diagnose what is causing palpitations/syncope (passing out).  Follow-Up: At [facility], you and your health needs are our priority.  As part of our continuing mission to provide you with exceptional heart care, we have created designated Provider Care Teams.  These Care Teams include your primary Cardiologist (physician) and Advanced Practice Providers (APPs -  Physician Assistants and Nurse Practitioners) who all work together to provide you with the care you need, when you need it.  Your next appointment:   6 month(s)  Provider:   You will see one of the following Advanced Practice Providers on your designated Care Team:   Tommye Standard, Orlinda Blalock "Jonni Sanger" Melissa, [place] Mamie Levers, NP   [place] Instructions  Your physician has requested you wear a ZIO patch monitor for 7 days.  This is a single patch monitor. [employer] supplies one patch monitor per enrollment. Additional stickers are not available. Please do not apply patch if you will be having a Nuclear Stress Test,  Echocardiogram, Cardiac CT, MRI, or Chest Xray during the period you would be wearing the  monitor. The patch cannot be worn during these tests. You cannot remove and  re-apply the  ZIO XT patch monitor.  Your ZIO patch monitor will be mailed 3 day USPS to your address on file. It may take 3-5 days  to receive your monitor after you have been enrolled.  Once you have received your monitor, please review the enclosed instructions. Your monitor  has already been registered assigning a specific monitor serial # to you.  Billing and Patient Assistance Program Information  We have supplied [employer] with any of your insurance information on file for billing purposes. [employer] offers a sliding scale Patient Assistance Program for patients that do not have  insurance, or whose insurance does not completely cover the cost of the ZIO monitor.  You must apply for the Patient Assistance Program to qualify for this discounted rate.  To apply, please call [employer] at [phone], select option 4, select option 2, ask to apply for  Patient Assistance Program. Theodore Demark will ask your household income, and how many people  are in your household. They will quote your out-of-pocket cost based on that information.  [employer] will also be able to set up a 20-month interest-free payment plan if needed.  Applying the monitor   Shave hair from upper left chest.  Hold abrader disc by orange tab. Rub abrader in 40 strokes over the upper left chest as  indicated in your monitor instructions.  Clean area with 4 enclosed alcohol pads. Let dry.  Apply patch as indicated in monitor instructions. Patch will be placed under collarbone on left  side of chest with arrow pointing upward.  Rub patch adhesive wings for 2 minutes. Remove white label  marked "1". Remove the white  label marked "2". Rub patch adhesive wings for 2 additional minutes.  While looking in a mirror, press and release button in center of patch. A small green light will  flash 3-4 times. This will be your only indicator that the monitor has been turned on.  Do not shower for the first 24 hours. You may shower after the  first 24 hours.  Press the button if you feel a symptom. You will hear a small click. Record Date, Time and  Symptom in the Patient Logbook.  When you are ready to remove the patch, follow instructions on the last 2 pages of Patient  Logbook. Stick patch monitor onto the last page of Patient Logbook.  Place Patient Logbook in the blue and white box. Use locking tab on box and tape box closed  securely. The blue and white box has prepaid postage on it. Please place it in the mailbox as  soon as possible. Your physician should have your test results approximately 7 days after the  monitor has been mailed back to [facility].  Call [place] at [phone] if you have questions regarding  your ZIO XT patch monitor. Call them immediately if you see an orange light blinking on your  monitor.  If your monitor falls off in less than 4 days, contact our Monitor department at [phone].  If your monitor becomes loose or falls off after 4 days call [employer] at [phone] for  suggestions on securing your monitor

## 2022-09-07 NOTE — Progress Notes (Unsigned)
Enrolled for [employer] to mail a ZIO XT long term holter monitor to the patients address on file.  

## 2022-09-07 NOTE — Progress Notes (Addendum)
Electrophysiology Office Follow up Visit Note:    Date:  09/07/2022   ID:  Kim Irwin, DOB 1965-10-11, MRN [id number]  PCP:  Everardo Beals, NP  [facility] Cardiologist:  Donato Heinz, MD  [place] Electrophysiologist:  Vickie Epley, MD    Interval History:    Kim Irwin is a 57 y.o. female who presents for a follow up visit.  She had an A-fib ablation on June 03, 2022.  During the ablation the veins and posterior wall were isolated.  She saw Butch Penny in follow-up July 07, 2022.  At that appointment she was maintaining sinus rhythm.  She is on Eliquis for an elevated CHA2DS2-VASc.  Today, she reports that she sometimes feels "blips" of her heart rhythm every couple of days. The sensation feels like skipped beats, but does not feel like before. Prior to the ablation procedure, she had frequent full episodes. These sensations have never progressed to a full episode recently. She reports having an episode in clinic today.  She has been taking 2.5 MG of Eliquis twice a day. She reports that she has some issues with swallowing the medication. She describes that she feels it getting stuck in her throat and that this sensation has been worsening recently. Nexium has not improved symptoms. She is still able to eat regularly.   She denies any chest pain, shortness of breath, or peripheral edema. No lightheadedness, headaches, syncope, orthopnea, or PND.  Past Medical History:  Diagnosis Date   Allergy    macrobid   Anxiety    Arthritis    hands   GERD (gastroesophageal reflux disease)    Hearing loss    slight, no hearing aids   Migraine    otc med prn   Paroxysmal A-fib (HCC)    Pneumonia    hx - resolved   SVD (spontaneous vaginal delivery)    x 1   Urinary tract infection    Hx - resolved    Past Surgical History:  Procedure Laterality Date   ATRIAL FIBRILLATION ABLATION N/A 06/03/2022   Procedure: ATRIAL FIBRILLATION ABLATION;   Surgeon: Vickie Epley, MD;  Location: [facility];  Service: Cardiovascular;  Laterality: N/A;   COLONOSCOPY  09/2003   Normal - [facility]   endometrial ablation     TUBAL LIGATION     WRIST SURGERY Right     Current Medications: Current Meds  Medication Sig   aspirin EC 81 MG tablet Take 1 tablet (81 mg total) by mouth daily. Swallow whole.   magnesium oxide (MAG-OX) 400 (240 Mg) MG tablet Take 400 mg by mouth daily.   Probiotic Product (PROBIOTIC PO) Take 1 capsule by mouth in the morning.   tetrahydrozoline-zinc (VISINE-AC) 0.05-0.25 % ophthalmic solution Place 2 drops into both eyes 3 (three) times daily as needed (dry eyes).   [DISCONTINUED] apixaban (ELIQUIS) 5 MG TABS tablet Take 1 tablet (5 mg total) by mouth 2 (two) times daily.     Allergies:   Macrobid [nitrofurantoin] and Diltiazem   Social History   Socioeconomic History   Marital status: Married    Spouse name: Not on file   Number of children: Not on file   Years of education: Not on file   Highest education level: Not on file  Occupational History   Not on file  Tobacco Use   Smoking status: Former    Packs/day: 0.20    Years: 15.00    Total pack years: 3.00  Types: Cigarettes    Quit date: 07/18/1996    Years since quitting: 26.1   Smokeless tobacco: Never   Tobacco comments:    Former smoker 07/07/22  Substance and Sexual Activity   Alcohol use: Yes    Alcohol/week: 21.0 standard drinks of alcohol    Types: 21 Glasses of wine per week    Comment: 3 glasses of wine daily 07/07/22   Drug use: Not Currently    Comment: Phentermine, Tramadol   Sexual activity: Yes    Birth control/protection: Surgical  Other Topics Concern   Not on file  Social History Narrative   Not on file   Social Determinants of Health   Financial Resource Strain: Not on file  Food Insecurity: Not on file  Transportation Needs: Not on file  Physical Activity: Not on file  Stress: Not on file  Social Connections:  Not on file     Family History: The patient's family history includes Asthma in her father; Heart disease in her mother; Hyperlipidemia in her father; Other in her mother; Stomach cancer in her paternal grandfather. There is no history of Colon cancer, Esophageal cancer, Rectal cancer, or Breast cancer.  ROS:   Please see the history of present illness.    (+)palpitations All other systems reviewed and are negative.  EKGs/Labs/Other Studies Reviewed:    The following studies were reviewed today:   Recent Labs: 05/20/2022: BUN 16; Creatinine, Ser 0.83; Hemoglobin 13.5; Platelets 270; Potassium 4.4; Sodium 137   Recent Lipid Panel    Component Value Date/Time   CHOL 178 06/21/2021 0818   TRIG 68 06/21/2021 0818   HDL 69 06/21/2021 0818   CHOLHDL 2.6 06/21/2021 0818   VLDL 14 06/21/2021 0818   LDLCALC 95 06/21/2021 0818    Physical Exam:    VS:  BP (!) 146/82   Pulse 78   Ht 5' 8"$  (1.727 m)   Wt 180 lb (81.6 kg)   SpO2 97%   BMI 27.37 kg/m     Wt Readings from Last 3 Encounters:  09/07/22 180 lb (81.6 kg)  07/07/22 177 lb 6.4 oz (80.5 kg)  06/03/22 175 lb (79.4 kg)     GEN:  Well nourished, well developed in no acute distress. tearful CARDIAC: RRR, no murmurs, rubs, gallops RESPIRATORY: clear to auscultation without rales, wheezing, or rhonchi      ASSESSMENT:    1. Paroxysmal atrial fibrillation (HCC)   2. TIA (transient ischemic attack)    PLAN:    In order of problems listed above:   #Paroxysmal atrial fibrillation Maintaining sinus rhythm after her November ablation.  She reports no sustained episodes of atrial fibrillation after the ablation.   Had a long discussion today about anticoagulation and stroke risk associated with her atrial fibrillation especially given her history of a TIA.  I recommended taking Eliquis 5 mg by mouth twice daily which would be the appropriate dose for her anticoagulant but she is very clear that she has no desire to  continue anticoagulant.  She would like to stop her medications.  After long discussion she is at least agreeable to taking an aspirin 81 mg by mouth once daily.  I would be in favor of this over no antiplatelet/anticoagulant.  We did discuss the increased risk of stroke being off of medications and she expressed understanding.   I will have her wear a 7-day ZIO monitor to assess her rhythm.   #Swallowing trouble Recent development.  Did not occur acutely after  the ablation.  Given the timeline and where she describes the sensation (back of throat) I wonder whether or not this could represent reflux.  Increased reflux can vagally mediated after catheter ablation.  I recommended that she restart her Protonix 40 mg by mouth once daily.   #History of TIA No recurrence Again, discussed anticoagulation as above but the patient declines and would prefer to take aspirin 81 mg by mouth once daily.   Follow-up 6 months with APP.   Medication Adjustments/Labs and Tests Ordered: Current medicines are reviewed at length with the patient today.  Concerns regarding medicines are outlined above.  Orders Placed This Encounter  Procedures   LONG TERM MONITOR (3-14 DAYS)   Meds ordered this encounter  Medications   pantoprazole (PROTONIX) 40 MG tablet    Sig: Take 1 tablet (40 mg total) by mouth daily.    Dispense:  90 tablet    Refill:  1   aspirin EC 81 MG tablet    Sig: Take 1 tablet (81 mg total) by mouth daily. Swallow whole.    Dispense:  90 tablet    Refill:  3    I,Mitra Faeizi,acting as a scribe for [employer], MD.,have documented all relevant documentation on the behalf of Vickie Epley, MD,as directed by  Vickie Epley, MD while in the presence of Vickie Epley, MD.  I, Vickie Epley, MD, have reviewed all documentation for this visit. The documentation on 09/07/22 for the exam, diagnosis, procedures, and orders are all accurate and complete.    Signed, Lars Mage, MD, [facility], [facility] 09/07/2022 2:00 PM    Electrophysiology [facility]

## 2022-09-10 DIAGNOSIS — I480 Paroxysmal atrial fibrillation: Secondary | ICD-10-CM

## 2022-09-10 DIAGNOSIS — G459 Transient cerebral ischemic attack, unspecified: Secondary | ICD-10-CM

## 2022-09-22 DIAGNOSIS — I480 Paroxysmal atrial fibrillation: Secondary | ICD-10-CM | POA: Diagnosis not present

## 2022-09-22 DIAGNOSIS — G459 Transient cerebral ischemic attack, unspecified: Secondary | ICD-10-CM | POA: Diagnosis not present

## 2022-09-26 IMAGING — MR MR HEAD W/O CM
12 of 13 series · 44 of 48 positions shown · non-contrast
Comparison: None.

CLINICAL DATA: TIA

EXAM:
MRI HEAD WITHOUT CONTRAST
TECHNIQUE: Multiplanar, multiecho pulse sequences of the brain and surrounding
structures were obtained without intravenous contrast.

[Series 5: DWI · axial · 3.0mm · 0.88mm/px · z∈[-103,+36]mm · 9 of 96 slices shown (1 of 4)]
[im 1/96]
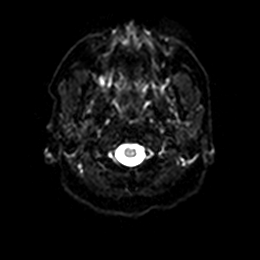
[im 12/96]
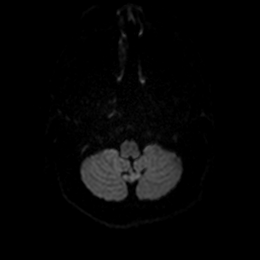
[im 24/96]
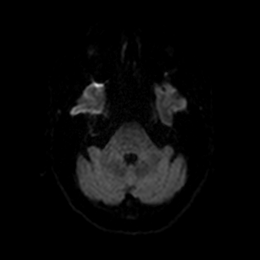
[im 36/96]
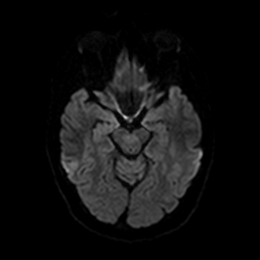
[im 48/96]
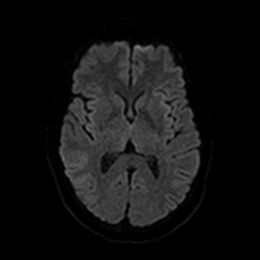
[im 60/96]
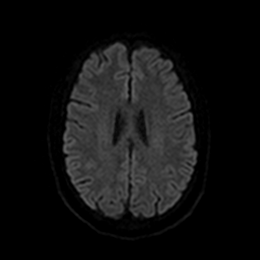
[im 72/96]
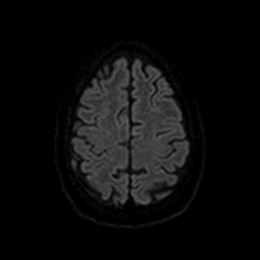
[im 84/96]
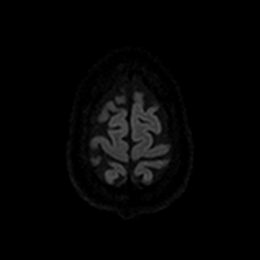
[im 96/96]
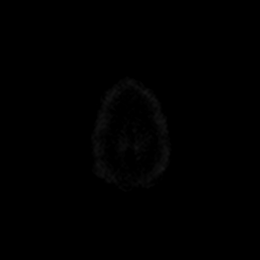

[Series 6: DWI · axial · 3.0mm · 0.88mm/px · z∈[-103,+36]mm · 4 of 48 slices shown (2 of 4)]
[im 1/48]
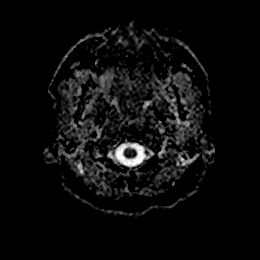
[im 16/48]
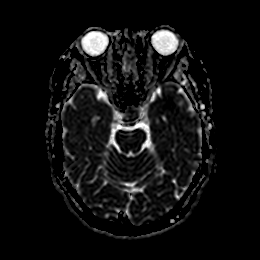
[im 32/48]
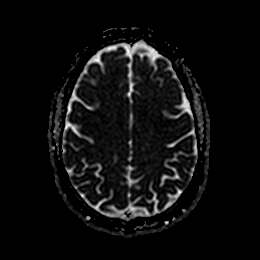
[im 48/48]
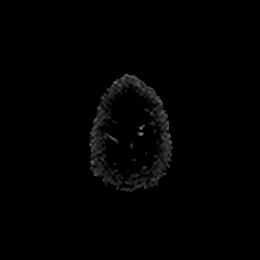

[Series 7: DWI · coronal · 4.0mm · 0.88mm/px · 5 of 64 slices shown (3 of 4)]
[im 1/64]
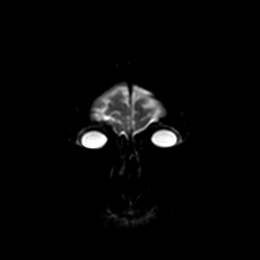
[im 16/64]
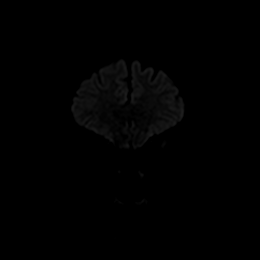
[im 32/64]
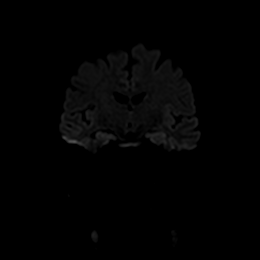
[im 48/64]
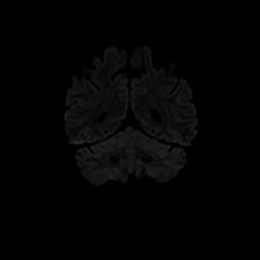
[im 64/64]
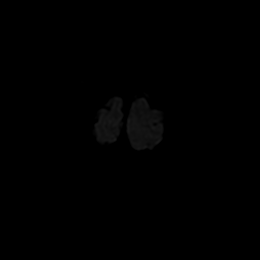

[Series 8: DWI · coronal · 4.0mm · 0.88mm/px · 3 of 32 slices shown (4 of 4)]
[im 1/32]
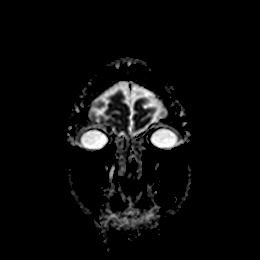
[im 16/32]
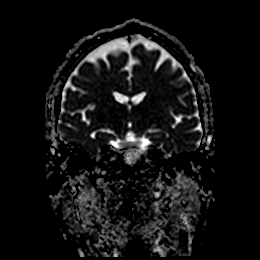
[im 32/32]
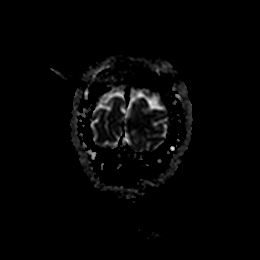

[Series 9: T1 · sagittal · 5.0mm · 0.75mm/px · 2 of 22 slices shown]
[im 1/22]
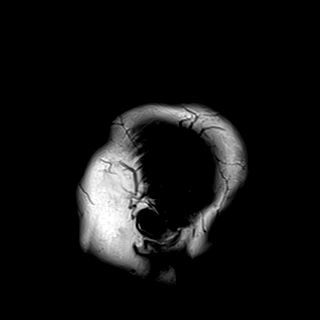
[im 22/22]
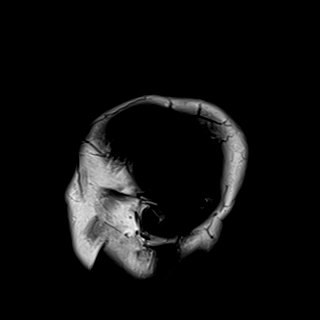

[Series 10: T2 · axial · 5.0mm · 0.72mm/px · z∈[-103,+33]mm · 2 of 24 slices shown (1 of 2)]
[im 1/24]
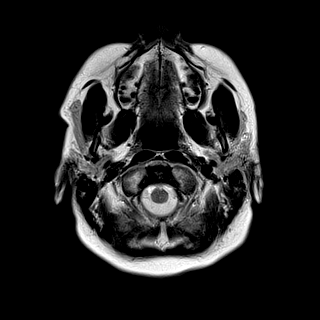
[im 24/24]
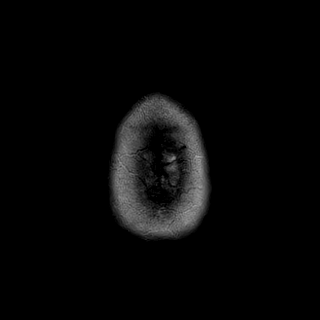

[Series 11: FLAIR · axial · 5.0mm · 0.45mm/px · z∈[-105,+31]mm · 2 of 24 slices shown]
[im 1/24]
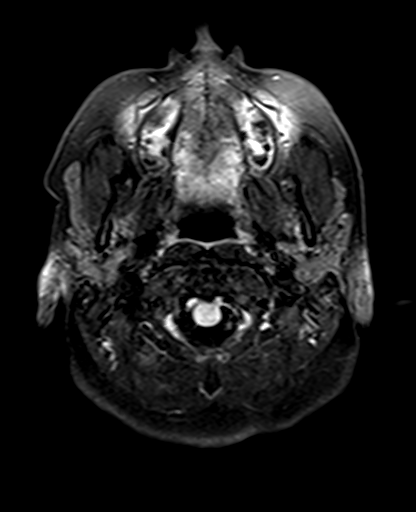
[im 24/24]
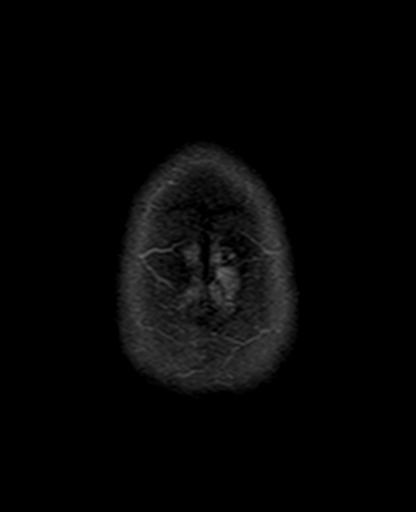

[Series 12: mag_images · axial · 3.0mm · 0.90mm/px · z∈[-107,+31]mm · 4 of 48 slices shown]
[im 1/48]
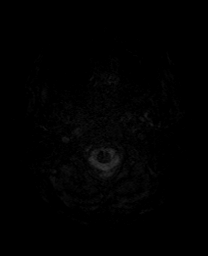
[im 16/48]
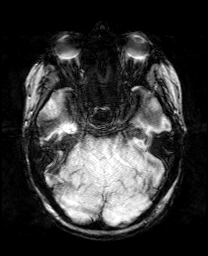
[im 32/48]
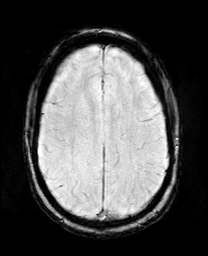
[im 48/48]
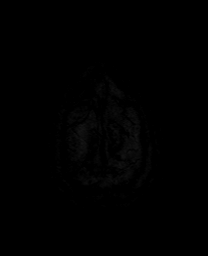

[Series 13: pha_images · axial · 3.0mm · 0.90mm/px · z∈[-107,+31]mm · 4 of 48 slices shown]
[im 1/48]
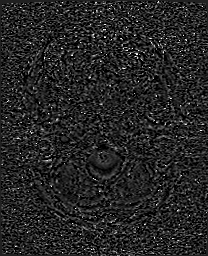
[im 16/48]
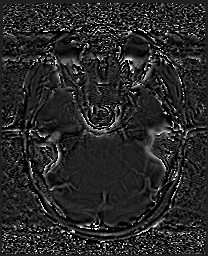
[im 32/48]
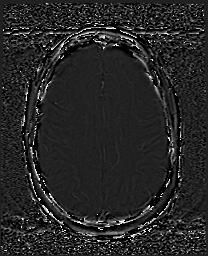
[im 48/48]
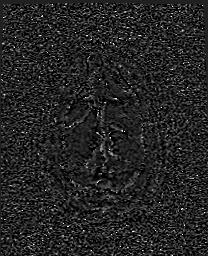

[Series 14: swi_images · axial · 3.0mm · 0.90mm/px · z∈[-107,+31]mm · 4 of 48 slices shown]
[im 1/48]
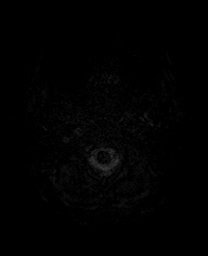
[im 16/48]
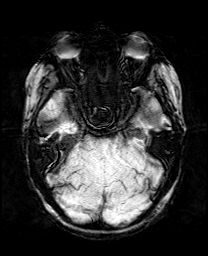
[im 32/48]
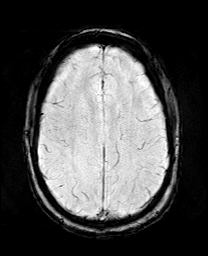
[im 48/48]
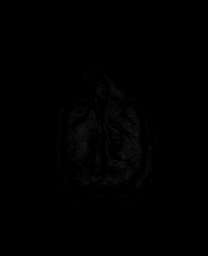

[Series 15: mip_images(sw) · axial · 24.0mm · 0.90mm/px · z∈[-97,+21]mm · 3 of 41 slices shown]
[im 1/41]
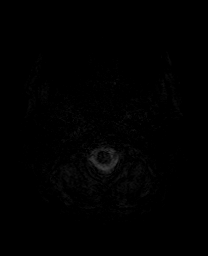
[im 21/41]
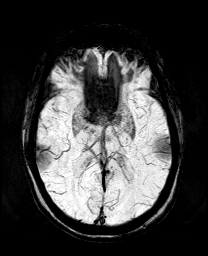
[im 41/41]
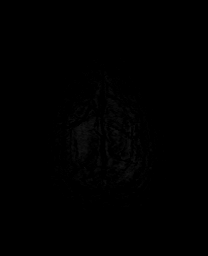

[Series 17: T2 · coronal · 5.0mm · 0.34mm/px · 2 of 28 slices shown (2 of 2)]
[im 1/28]
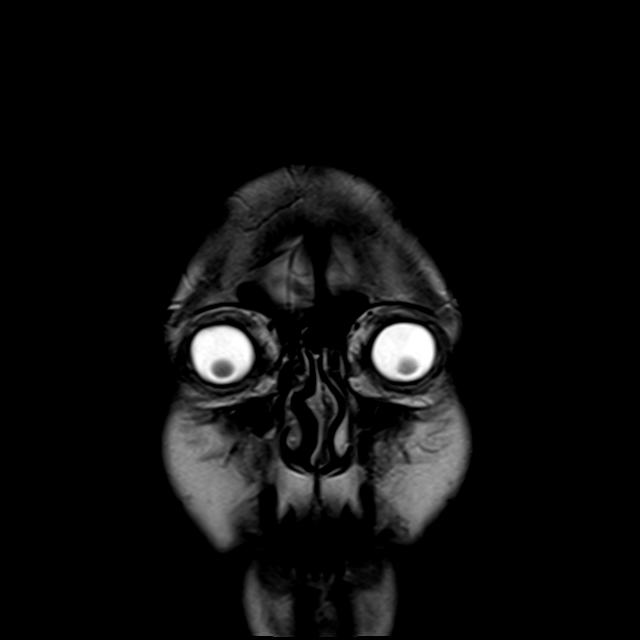
[im 28/28]
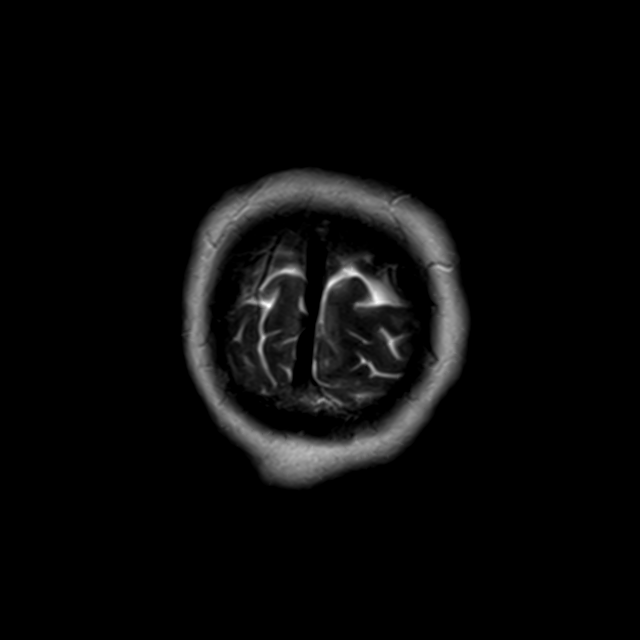

[44 of 48 positions shown; findings below may reference images not displayed]

FINDINGS: Brain: There is no acute infarction or intracranial hemorrhage.
There is no intracranial mass, mass effect, or edema. There is no
hydrocephalus or extra-axial fluid collection. Ventricles and sulci
are normal in size and configuration.

Vascular: Major vessel flow voids at the skull base are preserved.

Skull and upper cervical spine: Normal marrow signal is preserved.

Sinuses/Orbits: Paranasal sinuses are aerated. Orbits are
unremarkable.

Other: Sella is unremarkable.  Mastoid air cells are clear.
IMPRESSION: No acute infarction, hemorrhage, or mass.

## 2022-10-11 DIAGNOSIS — M79644 Pain in right finger(s): Secondary | ICD-10-CM | POA: Diagnosis not present

## 2022-10-11 DIAGNOSIS — M1811 Unilateral primary osteoarthritis of first carpometacarpal joint, right hand: Secondary | ICD-10-CM | POA: Diagnosis not present

## 2022-11-15 DIAGNOSIS — D2361 Other benign neoplasm of skin of right upper limb, including shoulder: Secondary | ICD-10-CM | POA: Diagnosis not present

## 2022-11-15 DIAGNOSIS — D2271 Melanocytic nevi of right lower limb, including hip: Secondary | ICD-10-CM | POA: Diagnosis not present

## 2022-11-15 DIAGNOSIS — D485 Neoplasm of uncertain behavior of skin: Secondary | ICD-10-CM | POA: Diagnosis not present

## 2022-12-20 DIAGNOSIS — M1811 Unilateral primary osteoarthritis of first carpometacarpal joint, right hand: Secondary | ICD-10-CM | POA: Diagnosis not present

## 2022-12-21 DIAGNOSIS — F419 Anxiety disorder, unspecified: Secondary | ICD-10-CM | POA: Diagnosis not present

## 2022-12-21 DIAGNOSIS — F40243 Fear of flying: Secondary | ICD-10-CM | POA: Diagnosis not present

## 2023-01-05 NOTE — Progress Notes (Deleted)
Cardiology Office Note Date:  01/05/2023  Patient ID:  Kim Irwin, Kim Irwin 28, 1967, MRN [id number] PCP:  Marva Panda, NP  Electrophysiologist: Dr. Lalla Brothers  ***refresh   Chief Complaint: *** 3 mo  History of Present Illness: Kim Irwin is a 57 y.o. female with history of AFib, TIA  She saw Dr. Lalla Brothers 09/07/22 for her post ablation visit with c/o some palpitations, different then that of her Afib  Reported trouble swallowing Eliquis pills but not eating. He repots a long discussion about anticoagulation and stroke risk associated with her atrial fibrillation especially given her history of a TIA.  Recommended taking Eliquis 5 mg by mouth twice daily which would be the appropriate dose for her anticoagulant but she was very clear that she had no desire to continue anticoagulant, wanted to stop her medications. Advised ASA 81mg  daily and monitoring Swallowing difficulty timeline not feltto be procedural related, suspected more GERD and advised Protonix 40mg  daily   Monitor showed the burden of AF is about 4%. Episodes are typically brief. Dr. Lalla Brothers recommended continued monitoring and EP APP f/u 3 mo  *** risk 3 *** revisit OAC? *** swallowing ok? *** symptoms, burden   AFib/AAd hx Diagnosed Feb 2022 PVI ablation 06/03/22   Past Medical History:  Diagnosis Date   Allergy    macrobid   Anxiety    Arthritis    hands   GERD (gastroesophageal reflux disease)    Hearing loss    slight, no hearing aids   Migraine    otc med prn   Paroxysmal A-fib (HCC)    Pneumonia    hx - resolved   SVD (spontaneous vaginal delivery)    x 1   Urinary tract infection    Hx - resolved    Past Surgical History:  Procedure Laterality Date   ATRIAL FIBRILLATION ABLATION N/A 06/03/2022   Procedure: ATRIAL FIBRILLATION ABLATION;  Surgeon: Lanier Prude, MD;  Location: [facility];  Service: Cardiovascular;  Laterality: N/A;   COLONOSCOPY  09/2003   Normal -  [facility]   endometrial ablation     TUBAL LIGATION     WRIST SURGERY Right     Current Outpatient Medications  Medication Sig Dispense Refill   aspirin EC 81 MG tablet Take 1 tablet (81 mg total) by mouth daily. Swallow whole. 90 tablet 3   magnesium oxide (MAG-OX) 400 (240 Mg) MG tablet Take 400 mg by mouth daily.     pantoprazole (PROTONIX) 40 MG tablet Take 1 tablet (40 mg total) by mouth daily. 90 tablet 1   Probiotic Product (PROBIOTIC PO) Take 1 capsule by mouth in the morning.     tetrahydrozoline-zinc (VISINE-AC) 0.05-0.25 % ophthalmic solution Place 2 drops into both eyes 3 (three) times daily as needed (dry eyes).     No current facility-administered medications for this visit.    Allergies:   Macrobid [nitrofurantoin] and Diltiazem   Social History:  The patient  reports that she quit smoking about 26 years ago. Her smoking use included cigarettes. She has a 3.00 pack-year smoking history. She has never used smokeless tobacco. She reports current alcohol use of about 21.0 standard drinks of alcohol per week. She reports that she does not currently use drugs.   Family History:  The patient's family history includes Asthma in her father; Heart disease in her mother; Hyperlipidemia in her father; Other in her mother; Stomach cancer in her paternal grandfather.  ROS:  Please see  the history of present illness.    All other systems are reviewed and otherwise negative.   PHYSICAL EXAM:  VS:  There were no vitals taken for this visit. BMI: There is no height or weight on file to calculate BMI. Well nourished, well developed, in no acute distress HEENT: normocephalic, atraumatic Neck: no JVD, carotid bruits or masses Cardiac:  *** RRR; no significant murmurs, no rubs, or gallops Lungs:  *** CTA b/l, no wheezing, rhonchi or rales Abd: soft, nontender MS: no deformity or *** atrophy Ext: *** no edema Skin: warm and dry, no rash Neuro:  No gross deficits appreciated Psych: euthymic  mood, full affect   EKG:  not done tdoay    March 2024: monitor HR 47 - 175 bpm, average 72 bpm. 4% burden of AF, longest episode . Average rate 110 bpm. Rare supraventricular and ventricular ectopy.    06/03/22: EPS/ablation CONCLUSIONS: 1. Successful PVI 2. Successful ablation/isolation of the posterior wall 3. Intracardiac echo reveals trivial pericardial effusion, normal LA architecture. 4. No early apparent complications. 5. Colchicine 0.6mg  PO BID x 5 days 6. Protonix 40mg  PO daily x 45 days.  04/13/22: cardiac CT IMPRESSION: 1. The left atrial appendage is a large chicken wing morphology without thrombus. 2. The depth of the appendage may not support a 35 mm Watchman FLX device that would be recommended based on maximum diameter. Structural heart discussion recommended. 3. A small PFO is present. 4. CAC score of 0.     06/21/21: TTE/bubble study 1. Left ventricular ejection fraction, by estimation, is 55 to 60%. The  left ventricle has normal function. The left ventricle has no regional  wall motion abnormalities. Left ventricular diastolic parameters were  normal.   2. Right ventricular systolic function is normal. The right ventricular  size is normal. Tricuspid regurgitation signal is inadequate for assessing  PA pressure.   3. The mitral valve is normal in structure. Mild mitral valve  regurgitation. No evidence of mitral stenosis.   4. The aortic valve is tricuspid. Aortic valve regurgitation is not  visualized. Aortic valve sclerosis is present, with no evidence of aortic  valve stenosis.   5. The inferior vena cava is normal in size with greater than 50%  respiratory variability, suggesting right atrial pressure of 3 mmHg.   6. Agitated saline contrast bubble study was negative, with no evidence  of any interatrial shunt. Technically difficult bubble study, but no  shunting seen.    Recent Labs: 05/20/2022: BUN 16; Creatinine, Ser 0.83;  Hemoglobin 13.5; Platelets 270; Potassium 4.4; Sodium 137  No results found for requested labs within last 365 days.   CrCl cannot be calculated (Patient's most recent lab result is older than the maximum 21 days allowed.).   Wt Readings from Last 3 Encounters:  09/07/22 180 lb (81.6 kg)  07/07/22 177 lb 6.4 oz (80.5 kg)  06/03/22 175 lb (79.4 kg)     Other studies reviewed: Additional studies/records reviewed today include: summarized above  ASSESSMENT AND PLAN:  Paroxysmal Afib CHA2DS2Vasc is 2 (for TIA), 3 w/gender Off a/c at patient preference Monitor with 4% burden ***   Disposition: F/u with ***  Current medicines are reviewed at length with the patient today.  The patient did not have any concerns regarding medicines.  Norma Fredrickson, PA-C 01/05/2023 4:57 PM     [facility] [address] [place] [address] [phone] (office)  [phone] (fax)

## 2023-01-06 ENCOUNTER — Encounter: Payer: Self-pay | Admitting: Physician Assistant

## 2023-01-06 ENCOUNTER — Encounter: Payer: Self-pay | Admitting: Cardiovascular Disease

## 2023-01-06 ENCOUNTER — Ambulatory Visit: Payer: BC Managed Care – PPO | Attending: Physician Assistant | Admitting: Physician Assistant

## 2023-01-24 DIAGNOSIS — Z01419 Encounter for gynecological examination (general) (routine) without abnormal findings: Secondary | ICD-10-CM | POA: Diagnosis not present

## 2023-01-24 DIAGNOSIS — R140 Abdominal distension (gaseous): Secondary | ICD-10-CM | POA: Diagnosis not present

## 2023-01-24 DIAGNOSIS — K5900 Constipation, unspecified: Secondary | ICD-10-CM | POA: Diagnosis not present

## 2023-02-20 ENCOUNTER — Other Ambulatory Visit: Payer: Self-pay | Admitting: Obstetrics and Gynecology

## 2023-02-20 DIAGNOSIS — Z1231 Encounter for screening mammogram for malignant neoplasm of breast: Secondary | ICD-10-CM

## 2023-03-21 DIAGNOSIS — Z1231 Encounter for screening mammogram for malignant neoplasm of breast: Secondary | ICD-10-CM

## 2023-03-22 ENCOUNTER — Ambulatory Visit: Admission: RE | Admit: 2023-03-22 | Payer: BC Managed Care – PPO | Source: Ambulatory Visit

## 2023-03-22 DIAGNOSIS — Z1231 Encounter for screening mammogram for malignant neoplasm of breast: Secondary | ICD-10-CM

## 2023-04-03 ENCOUNTER — Other Ambulatory Visit: Payer: Self-pay | Admitting: Orthopedic Surgery

## 2023-04-03 ENCOUNTER — Telehealth: Payer: Self-pay | Admitting: Cardiology

## 2023-04-03 NOTE — Telephone Encounter (Signed)
Pre-operative Risk Assessment    Patient Name: Kim Irwin  DOB: [birth year] MRN: [id number]      Request for Surgical Clearance    Procedure:   Right Thumb Trapeziectomy and Suspension Plaspy   Date of Surgery:  Clearance 04/13/23                                 Surgeon:  Dr Murrell Converse  Surgeon's Group or Practice Name:  [facility]  Phone number:  [phone] Fax number:  [phone]   Type of Clearance Requested:   - Medical  - Pharmacy:  Hold Aspirin Need to know how many days to hold ?   Type of Anesthesia:   Axillary Block    Additional requests/questions:      Signed, Emilie Rutter   04/03/2023, 1:37 PM

## 2023-04-04 NOTE — Telephone Encounter (Signed)
Name: Kim Irwin  DOB: 20-May-1966  MRN: [id number]  Primary Cardiologist: Little Ishikawa, MD  Chart reviewed as part of pre-operative protocol coverage. Because of [name]'s past medical history and time since last visit, she will require a follow-up in-office visit in order to better assess preoperative cardiovascular risk.  Pre-op covering staff: - Please schedule appointment and call patient to inform them. If patient already had an upcoming appointment within acceptable timeframe, please add "pre-op clearance" to the appointment notes so provider is aware. - Please contact requesting surgeon's office via preferred method (i.e, phone, fax) to inform them of need for appointment prior to surgery.  Ideally patient would continue aspirin 81 mg daily through the perioperative period, if bleeding risk is felt to be to high she can hold aspirin 81 mg for 5-7 days prior to procedure.   [name], NP  04/04/2023, 8:26 AM

## 2023-04-04 NOTE — Telephone Encounter (Signed)
Spoke with patient who is agreeable to do a tele visit on 9/18 with Azalee Course, PA at 3:35 pm. I will fax recommendations updates to requesting provider's office.

## 2023-04-05 ENCOUNTER — Encounter: Payer: Self-pay | Admitting: Physician Assistant

## 2023-04-05 ENCOUNTER — Ambulatory Visit: Payer: BC Managed Care – PPO | Attending: Physician Assistant | Admitting: Physician Assistant

## 2023-04-05 VITALS — BP 128/80 | HR 79 | Ht 67.0 in | Wt 179.0 lb

## 2023-04-05 DIAGNOSIS — I480 Paroxysmal atrial fibrillation: Secondary | ICD-10-CM | POA: Diagnosis not present

## 2023-04-05 DIAGNOSIS — Z01810 Encounter for preprocedural cardiovascular examination: Secondary | ICD-10-CM

## 2023-04-05 DIAGNOSIS — G459 Transient cerebral ischemic attack, unspecified: Secondary | ICD-10-CM | POA: Diagnosis not present

## 2023-04-05 NOTE — Patient Instructions (Signed)
Medication Instructions:  NO CHANGES *If you need a refill on your cardiac medications before your next appointment, please call your pharmacy*   Lab Work: NO LABS If you have labs (blood work) drawn today and your tests are completely normal, you will receive your results only by: MyChart Message (if you have MyChart) OR A paper copy in the mail If you have any lab test that is abnormal or we need to change your treatment, we will call you to review the results.   Testing/Procedures: NO TESTING   Follow-Up: At [facility], you and your health needs are our priority.  As part of our continuing mission to provide you with exceptional heart care, we have created designated Provider Care Teams.  These Care Teams include your primary Cardiologist (physician) and Advanced Practice Providers (APPs -  Physician Assistants and Nurse Practitioners) who all work together to provide you with the care you need, when you need it.    Your next appointment:   3-4 month(s)  Provider:   Steffanie Dunn, MD

## 2023-04-05 NOTE — Progress Notes (Unsigned)
R Gibbs Date of Exam: 06/21/2021 Medical Rec #:  [id number]        Height:       68.0 in Accession #:    [id number]       Weight:       160.0 lb Date of Birth:  [birth year]        BSA:          1.859 m Patient Age:    57 years         BP:           121/81 mmHg Patient Gender: F                HR:           74 bpm. Exam Location:  Inpatient  Procedure: 2D Echo, Cardiac Doppler and Color Doppler  Indications:     Afib, bubble  History:        Patient has prior history of Echocardiogram examinations, most recent 09/25/2020. Arrythmias:Atrial Fibrillation.  Sonographer:    Vanetta Shawl Referring Phys: [id number] MARGARET E PRAY  IMPRESSIONS   1. Left ventricular ejection fraction, by estimation, is 55 to 60%. The left ventricle has normal function. The left ventricle has no regional wall motion abnormalities. Left ventricular diastolic parameters were normal. 2. Right ventricular systolic function is normal. The right ventricular size is normal. Tricuspid regurgitation signal is inadequate for assessing PA pressure. 3. The mitral valve is normal in structure. Mild mitral valve regurgitation. No evidence of mitral stenosis. 4. The aortic valve is tricuspid. Aortic valve regurgitation is not visualized. Aortic valve sclerosis is present, with no evidence of aortic valve stenosis. 5. The inferior vena cava is normal in size with greater than 50% respiratory variability, suggesting right atrial pressure of 3 mmHg. 6. Agitated saline contrast bubble study was negative, with no evidence of any interatrial shunt. Technically difficult bubble study, but no shunting seen.  FINDINGS Left Ventricle: Left ventricular ejection fraction, by estimation, is 55 to 60%. The left ventricle has normal function. The left ventricle has no regional wall motion abnormalities. The left ventricular internal cavity size was normal in size. There is no left ventricular hypertrophy. Left ventricular diastolic parameters were normal.  Right Ventricle: The right ventricular size is normal. No increase in right ventricular wall thickness. Right ventricular systolic function is normal. Tricuspid regurgitation signal is inadequate for assessing PA pressure.  Left Atrium: Left atrial size was normal in size.  Right Atrium: Right atrial size was normal in size.  Pericardium: Trivial pericardial effusion is present.  Mitral Valve: The  mitral valve is normal in structure. Mild mitral valve regurgitation. No evidence of mitral valve stenosis.  Tricuspid Valve: The tricuspid valve is normal in structure. Tricuspid valve regurgitation is trivial.  Aortic Valve: The aortic valve is tricuspid. Aortic valve regurgitation is not visualized. Aortic valve sclerosis is present, with no evidence of aortic valve stenosis. Aortic valve mean gradient measures 4.0 mmHg. Aortic valve peak gradient measures 6.6 mmHg. Aortic valve area, by VTI measures 1.52 cm.  Pulmonic Valve: The pulmonic valve was not well visualized. Pulmonic valve regurgitation is not visualized.  Aorta: The aortic root and ascending aorta are structurally normal, with no evidence of dilitation.  Venous: The inferior vena cava is normal in size with greater than 50% respiratory variability, suggesting right atrial pressure of 3 mmHg.  IAS/Shunts: The interatrial septum was not well visualized. Agitated saline contrast bubble study was negative, with no evidence of any interatrial  Cardiology Office Note:  .   Date:  04/06/2023  ID:  Donavan Burnet, DOB 30-Jun-1966, MRN [id number] PCP: Elie Confer, NP  [facility] Providers Cardiologist:  Little Ishikawa, MD Electrophysiologist:  Lanier Prude, MD     History of Present Illness: AUSTA ANGELILLO is a 57 y.o. female with PMH of TIA, PAF, hyperlipidemia, GERD and anxiety.  She was admitted in February 2022 with new onset of atrial fibrillation with RVR after taking phentermine and tramadol.  She was found to have positive COVID-19 test.  She underwent DCCV twice in the ED unsuccessfully, she was started on IV amiodarone and diltiazem and converted back to sinus rhythm spontaneously.  Chest Vascor was 1 for gender alone, patient has declined anticoagulation therapy.  By the time she was followed up, she has stopped taking the diltiazem.  Echocardiogram obtained in December 2022 showed EF 60 to 65%, normal wall motion, mild MR.  Patient had a heart monitor in January 2023 that showed 11% atrial fibrillation burden, longest episode lasting 19 hours.  She was restarted on Cardizem 120 mg daily and also Eliquis 5 mg twice a day in January 2023.  She later reported intolerance to Eliquis and this was switched to Xarelto in May 2023.  She was referred to Dr. Lalla Brothers to discuss possibility of ablation versus Watchman device.  She ultimately underwent successful PVI ablation of A-fib by Dr. Lalla Brothers on 06/03/2022.  Postprocedure, patient was placed on colchicine for 5 days and Protonix for 45 days.  Intracardiac echocardiogram revealed trivial pericardial effusion and normal left atrial architecture.  She was last seen by Dr. Lalla Brothers at which time she had no sustained episode of A-fib after the ablation.  Patient wanted to come off of anticoagulation therapy even though Dr. Lalla Brothers recommended 5 mg twice a day of Eliquis, eventually she was agreeable to take 81 mg daily aspirin.  Repeat heart monitor obtained in  March 2024 showed 4% A-fib burden, longest episode 69 minute, average rate of 110 bpm, rare SVT and ventricular ectopy.  Dr. Lalla Brothers recommended continue monitoring given low of A-fib burden.  Patient presents today for preoperative clearance prior to right thumb surgery.  She has not had any recent palpitation or chest pain.  She has no problem accomplishing more than 4 METS of activity.  She has no exertional chest pain or shortness of breath.  EKG shows sinus rhythm without ischemic changes.  She is at acceptable risk to proceed with right thumb surgery without further workup.  If needed, she may hold aspirin for 5 days prior to the surgery and restart as soon as possible afterward at the surgeon's discretion.  She can follow-up with Dr. Lalla Brothers in 3 to 25-month.  ROS:   She denies chest pain, palpitations, dyspnea, pnd, orthopnea, n, v, dizziness, syncope, edema, weight gain, or early satiety. All other systems reviewed and are otherwise negative except as noted above.    Studies Reviewed: Marland Kitchen   EKG Interpretation Date/Time:  Wednesday April 05 2023 15:53:47 EDT Ventricular Rate:  79 PR Interval:  170 QRS Duration:  80 QT Interval:  374 QTC Calculation: 428 R Axis:   37  Text Interpretation: Sinus rhythm with occasional Premature ventricular complexes Confirmed by Azalee Course [phone]) on 04/06/2023 9:05:44 PM    Cardiac Studies & Procedures       ECHOCARDIOGRAM  ECHOCARDIOGRAM COMPLETE BUBBLE STUDY 06/21/2021  Narrative ECHOCARDIOGRAM REPORT    Patient Name:   [facility]  R Gibbs Date of Exam: 06/21/2021 Medical Rec #:  [id number]        Height:       68.0 in Accession #:    [id number]       Weight:       160.0 lb Date of Birth:  [birth year]        BSA:          1.859 m Patient Age:    57 years         BP:           121/81 mmHg Patient Gender: F                HR:           74 bpm. Exam Location:  Inpatient  Procedure: 2D Echo, Cardiac Doppler and Color Doppler  Indications:     Afib, bubble  History:        Patient has prior history of Echocardiogram examinations, most recent 09/25/2020. Arrythmias:Atrial Fibrillation.  Sonographer:    Vanetta Shawl Referring Phys: [id number] MARGARET E PRAY  IMPRESSIONS   1. Left ventricular ejection fraction, by estimation, is 55 to 60%. The left ventricle has normal function. The left ventricle has no regional wall motion abnormalities. Left ventricular diastolic parameters were normal. 2. Right ventricular systolic function is normal. The right ventricular size is normal. Tricuspid regurgitation signal is inadequate for assessing PA pressure. 3. The mitral valve is normal in structure. Mild mitral valve regurgitation. No evidence of mitral stenosis. 4. The aortic valve is tricuspid. Aortic valve regurgitation is not visualized. Aortic valve sclerosis is present, with no evidence of aortic valve stenosis. 5. The inferior vena cava is normal in size with greater than 50% respiratory variability, suggesting right atrial pressure of 3 mmHg. 6. Agitated saline contrast bubble study was negative, with no evidence of any interatrial shunt. Technically difficult bubble study, but no shunting seen.  FINDINGS Left Ventricle: Left ventricular ejection fraction, by estimation, is 55 to 60%. The left ventricle has normal function. The left ventricle has no regional wall motion abnormalities. The left ventricular internal cavity size was normal in size. There is no left ventricular hypertrophy. Left ventricular diastolic parameters were normal.  Right Ventricle: The right ventricular size is normal. No increase in right ventricular wall thickness. Right ventricular systolic function is normal. Tricuspid regurgitation signal is inadequate for assessing PA pressure.  Left Atrium: Left atrial size was normal in size.  Right Atrium: Right atrial size was normal in size.  Pericardium: Trivial pericardial effusion is present.  Mitral Valve: The  mitral valve is normal in structure. Mild mitral valve regurgitation. No evidence of mitral valve stenosis.  Tricuspid Valve: The tricuspid valve is normal in structure. Tricuspid valve regurgitation is trivial.  Aortic Valve: The aortic valve is tricuspid. Aortic valve regurgitation is not visualized. Aortic valve sclerosis is present, with no evidence of aortic valve stenosis. Aortic valve mean gradient measures 4.0 mmHg. Aortic valve peak gradient measures 6.6 mmHg. Aortic valve area, by VTI measures 1.52 cm.  Pulmonic Valve: The pulmonic valve was not well visualized. Pulmonic valve regurgitation is not visualized.  Aorta: The aortic root and ascending aorta are structurally normal, with no evidence of dilitation.  Venous: The inferior vena cava is normal in size with greater than 50% respiratory variability, suggesting right atrial pressure of 3 mmHg.  IAS/Shunts: The interatrial septum was not well visualized. Agitated saline contrast bubble study was negative, with no evidence of any interatrial  Cardiology Office Note:  .   Date:  04/06/2023  ID:  Donavan Burnet, DOB 30-Jun-1966, MRN [id number] PCP: Elie Confer, NP  [facility] Providers Cardiologist:  Little Ishikawa, MD Electrophysiologist:  Lanier Prude, MD     History of Present Illness: AUSTA ANGELILLO is a 57 y.o. female with PMH of TIA, PAF, hyperlipidemia, GERD and anxiety.  She was admitted in February 2022 with new onset of atrial fibrillation with RVR after taking phentermine and tramadol.  She was found to have positive COVID-19 test.  She underwent DCCV twice in the ED unsuccessfully, she was started on IV amiodarone and diltiazem and converted back to sinus rhythm spontaneously.  Chest Vascor was 1 for gender alone, patient has declined anticoagulation therapy.  By the time she was followed up, she has stopped taking the diltiazem.  Echocardiogram obtained in December 2022 showed EF 60 to 65%, normal wall motion, mild MR.  Patient had a heart monitor in January 2023 that showed 11% atrial fibrillation burden, longest episode lasting 19 hours.  She was restarted on Cardizem 120 mg daily and also Eliquis 5 mg twice a day in January 2023.  She later reported intolerance to Eliquis and this was switched to Xarelto in May 2023.  She was referred to Dr. Lalla Brothers to discuss possibility of ablation versus Watchman device.  She ultimately underwent successful PVI ablation of A-fib by Dr. Lalla Brothers on 06/03/2022.  Postprocedure, patient was placed on colchicine for 5 days and Protonix for 45 days.  Intracardiac echocardiogram revealed trivial pericardial effusion and normal left atrial architecture.  She was last seen by Dr. Lalla Brothers at which time she had no sustained episode of A-fib after the ablation.  Patient wanted to come off of anticoagulation therapy even though Dr. Lalla Brothers recommended 5 mg twice a day of Eliquis, eventually she was agreeable to take 81 mg daily aspirin.  Repeat heart monitor obtained in  March 2024 showed 4% A-fib burden, longest episode 69 minute, average rate of 110 bpm, rare SVT and ventricular ectopy.  Dr. Lalla Brothers recommended continue monitoring given low of A-fib burden.  Patient presents today for preoperative clearance prior to right thumb surgery.  She has not had any recent palpitation or chest pain.  She has no problem accomplishing more than 4 METS of activity.  She has no exertional chest pain or shortness of breath.  EKG shows sinus rhythm without ischemic changes.  She is at acceptable risk to proceed with right thumb surgery without further workup.  If needed, she may hold aspirin for 5 days prior to the surgery and restart as soon as possible afterward at the surgeon's discretion.  She can follow-up with Dr. Lalla Brothers in 3 to 25-month.  ROS:   She denies chest pain, palpitations, dyspnea, pnd, orthopnea, n, v, dizziness, syncope, edema, weight gain, or early satiety. All other systems reviewed and are otherwise negative except as noted above.    Studies Reviewed: Marland Kitchen   EKG Interpretation Date/Time:  Wednesday April 05 2023 15:53:47 EDT Ventricular Rate:  79 PR Interval:  170 QRS Duration:  80 QT Interval:  374 QTC Calculation: 428 R Axis:   37  Text Interpretation: Sinus rhythm with occasional Premature ventricular complexes Confirmed by Azalee Course [phone]) on 04/06/2023 9:05:44 PM    Cardiac Studies & Procedures       ECHOCARDIOGRAM  ECHOCARDIOGRAM COMPLETE BUBBLE STUDY 06/21/2021  Narrative ECHOCARDIOGRAM REPORT    Patient Name:   [facility]

## 2023-04-06 ENCOUNTER — Encounter (HOSPITAL_BASED_OUTPATIENT_CLINIC_OR_DEPARTMENT_OTHER): Payer: Self-pay | Admitting: Orthopedic Surgery

## 2023-04-06 NOTE — Telephone Encounter (Signed)
Patient Name: Kim Irwin  DOB: April 26, 1966 MRN: [id number]  Primary Cardiologist: Little Ishikawa, MD  Chart reviewed as part of pre-operative protocol coverage. Given past medical history and time since last visit, based on ACC/AHA guidelines, Kim Irwin is at acceptable risk for the planned procedure without further cardiovascular testing.   If needed, she may hold aspirin for 5 days prior to the surgery and restart as soon as possible afterward at the surgeon's discretion.  The patient was advised that if she develops new symptoms prior to surgery to contact our office to arrange for a follow-up visit, and she verbalized understanding.  I will route this recommendation to the requesting party via Epic fax function and remove from pre-op pool.  Please call with questions.  [place], [place] 04/06/2023, 9:11 PM

## 2023-04-10 NOTE — Progress Notes (Signed)
Pre op Ensure provided to pt w/ instruction.

## 2023-04-13 ENCOUNTER — Ambulatory Visit (HOSPITAL_BASED_OUTPATIENT_CLINIC_OR_DEPARTMENT_OTHER): Payer: BC Managed Care – PPO | Admitting: Anesthesiology

## 2023-04-13 ENCOUNTER — Encounter (HOSPITAL_BASED_OUTPATIENT_CLINIC_OR_DEPARTMENT_OTHER)
Admission: RE | Disposition: A | Discharge status: Home / Self Care | Payer: Self-pay | Source: Ambulatory Visit | Attending: Orthopedic Surgery

## 2023-04-13 ENCOUNTER — Encounter (HOSPITAL_BASED_OUTPATIENT_CLINIC_OR_DEPARTMENT_OTHER): Payer: Self-pay | Admitting: Orthopedic Surgery

## 2023-04-13 ENCOUNTER — Other Ambulatory Visit: Payer: Self-pay

## 2023-04-13 ENCOUNTER — Ambulatory Visit (HOSPITAL_BASED_OUTPATIENT_CLINIC_OR_DEPARTMENT_OTHER)
Admission: RE | Admit: 2023-04-13 | Discharge: 2023-04-13 | Disposition: A | Discharge status: Home / Self Care | Payer: BC Managed Care – PPO | Source: Ambulatory Visit | Attending: Orthopedic Surgery | Admitting: Orthopedic Surgery

## 2023-04-13 ENCOUNTER — Ambulatory Visit (HOSPITAL_BASED_OUTPATIENT_CLINIC_OR_DEPARTMENT_OTHER): Payer: BC Managed Care – PPO

## 2023-04-13 DIAGNOSIS — Z87891 Personal history of nicotine dependence: Secondary | ICD-10-CM | POA: Diagnosis not present

## 2023-04-13 DIAGNOSIS — K219 Gastro-esophageal reflux disease without esophagitis: Secondary | ICD-10-CM | POA: Insufficient documentation

## 2023-04-13 DIAGNOSIS — F419 Anxiety disorder, unspecified: Secondary | ICD-10-CM | POA: Diagnosis not present

## 2023-04-13 DIAGNOSIS — Z7982 Long term (current) use of aspirin: Secondary | ICD-10-CM | POA: Insufficient documentation

## 2023-04-13 DIAGNOSIS — R519 Headache, unspecified: Secondary | ICD-10-CM | POA: Insufficient documentation

## 2023-04-13 DIAGNOSIS — M1811 Unilateral primary osteoarthritis of first carpometacarpal joint, right hand: Secondary | ICD-10-CM | POA: Insufficient documentation

## 2023-04-13 DIAGNOSIS — Z01818 Encounter for other preprocedural examination: Secondary | ICD-10-CM

## 2023-04-13 DIAGNOSIS — I480 Paroxysmal atrial fibrillation: Secondary | ICD-10-CM | POA: Diagnosis not present

## 2023-04-13 HISTORY — PX: TENDON TRANSFER: SHX6109

## 2023-04-13 HISTORY — PX: CARPOMETACARPEL SUSPENSION PLASTY: SHX5005

## 2023-04-13 SURGERY — CARPOMETACARPEL (CMC) SUSPENSION PLASTY
Anesthesia: Monitor Anesthesia Care | Site: Thumb | Laterality: Right

## 2023-04-13 MED ORDER — OXYCODONE-ACETAMINOPHEN 5-325 MG PO TABS
ORAL_TABLET | ORAL | 0 refills | Status: AC
Start: 2023-04-13 — End: ?

## 2023-04-13 MED ORDER — SUCCINYLCHOLINE CHLORIDE 200 MG/10ML IV SOSY
PREFILLED_SYRINGE | INTRAVENOUS | Status: AC
  Filled 2023-04-13: qty 10

## 2023-04-13 MED ORDER — FENTANYL CITRATE (PF) 100 MCG/2ML IJ SOLN
100.0000 ug | Freq: Once | INTRAMUSCULAR | Status: AC
  Administered 2023-04-13: 100 ug via INTRAVENOUS

## 2023-04-13 MED ORDER — FENTANYL CITRATE (PF) 100 MCG/2ML IJ SOLN: 25.0000 ug | INTRAMUSCULAR | Status: DC | PRN

## 2023-04-13 MED ORDER — MIDAZOLAM HCL 2 MG/2ML IJ SOLN
INTRAMUSCULAR | Status: AC
  Filled 2023-04-13: qty 2

## 2023-04-13 MED ORDER — FENTANYL CITRATE (PF) 100 MCG/2ML IJ SOLN
INTRAMUSCULAR | Status: AC
  Filled 2023-04-13: qty 2

## 2023-04-13 MED ORDER — LIDOCAINE 2% (20 MG/ML) 5 ML SYRINGE
INTRAMUSCULAR | Status: AC
  Filled 2023-04-13: qty 5

## 2023-04-13 MED ORDER — MIDAZOLAM HCL 2 MG/2ML IJ SOLN
2.0000 mg | Freq: Once | INTRAMUSCULAR | Status: AC
  Administered 2023-04-13: 2 mg via INTRAVENOUS

## 2023-04-13 MED ORDER — ROPIVACAINE HCL 5 MG/ML IJ SOLN
INTRAMUSCULAR | Status: DC | PRN
Start: 2023-04-13 — End: 2023-04-13
  Administered 2023-04-13: 25 mL via PERINEURAL

## 2023-04-13 MED ORDER — CEFAZOLIN SODIUM-DEXTROSE 2-4 GM/100ML-% IV SOLN
2.0000 g | INTRAVENOUS | Status: AC
  Administered 2023-04-13: 2 g via INTRAVENOUS

## 2023-04-13 MED ORDER — ONDANSETRON HCL 4 MG/2ML IJ SOLN
INTRAMUSCULAR | Status: DC | PRN
  Administered 2023-04-13: 4 mg via INTRAVENOUS

## 2023-04-13 MED ORDER — PROPOFOL 500 MG/50ML IV EMUL
INTRAVENOUS | Status: DC | PRN
  Administered 2023-04-13: 125 ug/kg/min via INTRAVENOUS

## 2023-04-13 MED ORDER — LIDOCAINE 2% (20 MG/ML) 5 ML SYRINGE
INTRAMUSCULAR | Status: DC | PRN
  Administered 2023-04-13: 40 mg via INTRAVENOUS

## 2023-04-13 MED ORDER — 0.9 % SODIUM CHLORIDE (POUR BTL) OPTIME
TOPICAL | Status: DC | PRN
Start: 2023-04-13 — End: 2023-04-13
  Administered 2023-04-13: 200 mL

## 2023-04-13 MED ORDER — FENTANYL CITRATE (PF) 100 MCG/2ML IJ SOLN
INTRAMUSCULAR | Status: DC | PRN
  Administered 2023-04-13: 100 ug via INTRAVENOUS

## 2023-04-13 MED ORDER — CEFAZOLIN SODIUM-DEXTROSE 2-4 GM/100ML-% IV SOLN
INTRAVENOUS | Status: AC
  Filled 2023-04-13: qty 100

## 2023-04-13 MED ORDER — DEXAMETHASONE SODIUM PHOSPHATE 10 MG/ML IJ SOLN
INTRAMUSCULAR | Status: DC | PRN
  Administered 2023-04-13: 10 mg

## 2023-04-13 MED ORDER — ONDANSETRON HCL 4 MG/2ML IJ SOLN
INTRAMUSCULAR | Status: AC
  Filled 2023-04-13: qty 2

## 2023-04-13 MED ORDER — MIDAZOLAM HCL 5 MG/5ML IJ SOLN
INTRAMUSCULAR | Status: DC | PRN
  Administered 2023-04-13: 2 mg via INTRAVENOUS

## 2023-04-13 MED ORDER — LACTATED RINGERS IV SOLN: INTRAVENOUS | Status: DC

## 2023-04-13 SURGICAL SUPPLY — 90 items
APL PRP STRL LF DISP 70% ISPRP (MISCELLANEOUS) ×1
BAG DECANTER FOR FLEXI CONT (MISCELLANEOUS) IMPLANT
BALL CTTN LRG ABS STRL LF (GAUZE/BANDAGES/DRESSINGS)
BLADE MINI RND TIP GREEN BEAV (BLADE) ×1 IMPLANT
BLADE SURG 15 STRL LF DISP TIS (BLADE) ×2 IMPLANT
BLADE SURG 15 STRL SS (BLADE) ×2
BNDG CMPR 5X2 KNTD ELC UNQ LF (GAUZE/BANDAGES/DRESSINGS)
BNDG CMPR 5X3 KNIT ELC UNQ LF (GAUZE/BANDAGES/DRESSINGS) ×1
BNDG CMPR 9X4 STRL LF SNTH (GAUZE/BANDAGES/DRESSINGS) ×1
BNDG ELASTIC 2INX 5YD STR LF (GAUZE/BANDAGES/DRESSINGS) IMPLANT
BNDG ELASTIC 3INX 5YD STR LF (GAUZE/BANDAGES/DRESSINGS) ×1 IMPLANT
BNDG ESMARK 4X9 LF (GAUZE/BANDAGES/DRESSINGS) ×1 IMPLANT
BNDG GAUZE DERMACEA FLUFF 4 (GAUZE/BANDAGES/DRESSINGS) ×1 IMPLANT
BNDG GZE DERMACEA 4 6PLY (GAUZE/BANDAGES/DRESSINGS) ×1
CHLORAPREP W/TINT 26 (MISCELLANEOUS) ×1 IMPLANT
CORD BIPOLAR FORCEPS 12FT (ELECTRODE) ×1 IMPLANT
COTTONBALL LRG STERILE PKG (GAUZE/BANDAGES/DRESSINGS) IMPLANT
COVER BACK TABLE 60X90IN (DRAPES) ×1 IMPLANT
COVER MAYO STAND STRL (DRAPES) ×1 IMPLANT
CUFF TOURN SGL QUICK 18X4 (TOURNIQUET CUFF) ×1 IMPLANT
DRAPE EXTREMITY T 121X128X90 (DISPOSABLE) ×1 IMPLANT
DRAPE OEC MINIVIEW 54X84 (DRAPES) ×1 IMPLANT
DRAPE SURG 17X23 STRL (DRAPES) ×1 IMPLANT
GAUZE 4X4 16PLY ~~LOC~~+RFID DBL (SPONGE) IMPLANT
GAUZE PAD ABD 8X10 STRL (GAUZE/BANDAGES/DRESSINGS) IMPLANT
GAUZE SPONGE 4X4 12PLY STRL (GAUZE/BANDAGES/DRESSINGS) ×1 IMPLANT
GAUZE XEROFORM 1X8 LF (GAUZE/BANDAGES/DRESSINGS) ×1 IMPLANT
GLOVE BIO SURGEON STRL SZ7.5 (GLOVE) ×1 IMPLANT
GLOVE BIOGEL PI IND STRL 6.5 (GLOVE) IMPLANT
GLOVE BIOGEL PI IND STRL 7.0 (GLOVE) IMPLANT
GLOVE BIOGEL PI IND STRL 8 (GLOVE) ×1 IMPLANT
GLOVE BIOGEL PI IND STRL 8.5 (GLOVE) IMPLANT
GLOVE SURG ORTHO 8.0 STRL STRW (GLOVE) IMPLANT
GLOVE SURG SS PI 6.5 STRL IVOR (GLOVE) IMPLANT
GLOVE SURG SS PI 7.0 STRL IVOR (GLOVE) IMPLANT
GOWN STRL REUS W/ TWL LRG LVL3 (GOWN DISPOSABLE) ×1 IMPLANT
GOWN STRL REUS W/TWL LRG LVL3 (GOWN DISPOSABLE) ×3
GOWN STRL REUS W/TWL XL LVL3 (GOWN DISPOSABLE) ×1 IMPLANT
KIT BUTTON SUT MICROLINK LP (Anchor) IMPLANT
LOOP VASCLR MAXI BLUE 18IN ST (MISCELLANEOUS) IMPLANT
LOOP VASCULAR MAXI 18 BLUE (MISCELLANEOUS)
NDL HYPO 25X1 1.5 SAFETY (NEEDLE) IMPLANT
NDL KEITH (NEEDLE) IMPLANT
NDL SAFETY ECLIP 18X1.5 (MISCELLANEOUS) IMPLANT
NEEDLE HYPO 25X1 1.5 SAFETY (NEEDLE)
NEEDLE KEITH (NEEDLE)
NS IRRIG 1000ML POUR BTL (IV SOLUTION) ×1 IMPLANT
PACK BASIN DAY SURGERY FS (CUSTOM PROCEDURE TRAY) ×1 IMPLANT
PAD CAST 3X4 CTTN HI CHSV (CAST SUPPLIES) ×1 IMPLANT
PAD CAST 4YDX4 CTTN HI CHSV (CAST SUPPLIES) IMPLANT
PADDING CAST ABS COTTON 3X4 (CAST SUPPLIES) IMPLANT
PADDING CAST ABS COTTON 4X4 ST (CAST SUPPLIES) ×1 IMPLANT
PADDING CAST COTTON 3X4 STRL (CAST SUPPLIES) ×1
PADDING CAST COTTON 4X4 STRL (CAST SUPPLIES)
PIN TRAPEZIUM CMC MICROLINK (BIT) IMPLANT
SLEEVE SCD COMPRESS KNEE MED (STOCKING) IMPLANT
SLING ARM FOAM STRAP LRG (SOFTGOODS) IMPLANT
SPIKE FLUID TRANSFER (MISCELLANEOUS) IMPLANT
SPLINT PLASTER CAST FAST 5X30 (CAST SUPPLIES) IMPLANT
SPLINT PLASTER CAST XFAST 3X15 (CAST SUPPLIES) IMPLANT
SPLINT PLASTER CAST XFAST 4X15 (CAST SUPPLIES) IMPLANT
STOCKINETTE 4X48 STRL (DRAPES) ×1 IMPLANT
SUT CHROMIC 5 0 P 3 (SUTURE) IMPLANT
SUT ETHIBOND 3-0 V-5 (SUTURE) IMPLANT
SUT ETHILON 3 0 PS 1 (SUTURE) IMPLANT
SUT ETHILON 4 0 PS 2 18 (SUTURE) ×1 IMPLANT
SUT FIBERWIRE 2-0 18 17.9 3/8 (SUTURE) ×1
SUT FIBERWIRE 3-0 18 TAPR NDL (SUTURE)
SUT FIBERWIRE 4-0 18 DIAM BLUE (SUTURE)
SUT MERSILENE 2.0 SH NDLE (SUTURE) IMPLANT
SUT MERSILENE 4 0 P 3 (SUTURE) IMPLANT
SUT PROLENE 2 0 SH DA (SUTURE) IMPLANT
SUT PROLENE 6 0 P 1 18 (SUTURE) IMPLANT
SUT SILK 2 0 PERMA HAND 18 BK (SUTURE) IMPLANT
SUT SILK 4 0 PS 2 (SUTURE) IMPLANT
SUT VIC AB 3-0 PS1 18 (SUTURE)
SUT VIC AB 3-0 PS1 18XBRD (SUTURE) IMPLANT
SUT VIC AB 4-0 P-3 18XBRD (SUTURE) IMPLANT
SUT VIC AB 4-0 P3 18 (SUTURE)
SUT VIC AB 4-0 PS2 18 (SUTURE) ×1 IMPLANT
SUT VICRYL 0 SH 27 (SUTURE) IMPLANT
SUTURE FIBERWR 2-0 18 17.9 3/8 (SUTURE) ×1 IMPLANT
SUTURE FIBERWR 3-0 18 TAPR NDL (SUTURE) IMPLANT
SUTURE FIBERWR 4-0 18 DIA BLUE (SUTURE) IMPLANT
SYR BULB EAR ULCER 3OZ GRN STR (SYRINGE) ×1 IMPLANT
SYR CONTROL 10ML LL (SYRINGE) IMPLANT
TOWEL GREEN STERILE FF (TOWEL DISPOSABLE) ×2 IMPLANT
TUBE NG 5FR 35IN ENFIT (TUBING) IMPLANT
UNDERPAD 30X36 HEAVY ABSORB (UNDERPADS AND DIAPERS) ×1 IMPLANT
VASCULAR TIE MAXI BLUE 18IN ST (MISCELLANEOUS)

## 2023-04-13 NOTE — Progress Notes (Signed)
Assisted Dr. Armond Hang with right, supraclavicular, ultrasound guided block. Side rails up, monitors on throughout procedure. See vital signs in flow sheet. Tolerated Procedure well.

## 2023-04-13 NOTE — Transfer of Care (Signed)
Immediate Anesthesia Transfer of Care Note  Patient: Kim Irwin  Procedure(s) Performed: RIGHT THUMB TRAPEZIECTOMY AND SUSPENSIONPLASTY (Right: Thumb) TENDON TRANSFER (Right: Thumb)  Patient Location: PACU  Anesthesia Type:MAC combined with regional for post-op pain  Level of Consciousness: awake, alert , oriented, drowsy, and patient cooperative  Airway & Oxygen Therapy: Patient Spontanous Breathing and Patient connected to face mask oxygen  Post-op Assessment: Report given to RN and Post -op Vital signs reviewed and stable  Post vital signs: Reviewed and stable  Last Vitals:  Vitals Value Taken Time  BP    Temp    Pulse 65 04/13/23 1330  Resp 14 04/13/23 1330  SpO2 91 % 04/13/23 1330  Vitals shown include unfiled device data.  Last Pain:  Vitals:   04/13/23 0918  TempSrc: Oral  PainSc: 2       Patients Stated Pain Goal: 2 (04/13/23 5427)  Complications: No notable events documented.

## 2023-04-13 NOTE — Anesthesia Preprocedure Evaluation (Addendum)
Anesthesia Evaluation  Patient identified by MRN, date of birth, ID band Patient awake    Reviewed: Allergy & Precautions, NPO status , Patient's Chart, lab work & pertinent test results  Airway Mallampati: I  TM Distance: >3 FB Neck ROM: Full  Mouth opening: Limited Mouth Opening  Dental no notable dental hx. (+) Teeth Intact, Dental Advisory Given   Pulmonary former smoker   Pulmonary exam normal breath sounds clear to auscultation       Cardiovascular Normal cardiovascular exam+ dysrhythmias Atrial Fibrillation  Rhythm:Regular Rate:Normal     Neuro/Psych  Headaches  Anxiety     TIA negative psych ROS   GI/Hepatic Neg liver ROS,GERD  ,,  Endo/Other  negative endocrine ROS    Renal/GU negative Renal ROS  negative genitourinary   Musculoskeletal  (+) Arthritis ,    Abdominal   Peds  Hematology negative hematology ROS (+)   Anesthesia Other Findings   Reproductive/Obstetrics                             Anesthesia Physical Anesthesia Plan  ASA: 3  Anesthesia Plan: MAC and Regional   Post-op Pain Management: Regional block*   Induction: Intravenous  PONV Risk Score and Plan: 2 and Propofol infusion, Treatment may vary due to age or medical condition, Midazolam, Ondansetron and Dexamethasone  Airway Management Planned: Natural Airway  Additional Equipment:   Intra-op Plan:   Post-operative Plan:   Informed Consent: I have reviewed the patients History and Physical, chart, labs and discussed the procedure including the risks, benefits and alternatives for the proposed anesthesia with the patient or authorized representative who has indicated his/her understanding and acceptance.     Dental advisory given  Plan Discussed with: CRNA  Anesthesia Plan Comments:        Anesthesia Quick Evaluation

## 2023-04-13 NOTE — Op Note (Signed)
NAME: Kim Irwin MEDICAL RECORD NO: [id number] DATE OF BIRTH: Mar 23, 1966 FACILITY: [facility] LOCATION: [facility] PHYSICIAN: Tami Ribas, MD   OPERATIVE REPORT   DATE OF PROCEDURE: 04/13/23    PREOPERATIVE DIAGNOSIS: Right thumb CMC arthritis   POSTOPERATIVE DIAGNOSIS: Right thumb CMC arthritis   PROCEDURE: 1.  Right thumb trapeziectomy 2.  Right thumb suspension plasty   SURGEON:  Betha Loa, M.D.   ASSISTANT: Cindee Salt, MD   ANESTHESIA:  Regional with sedation   INTRAVENOUS FLUIDS:  Per anesthesia flow sheet.   ESTIMATED BLOOD LOSS:  Minimal.   COMPLICATIONS:  None.   SPECIMENS:  none   TOURNIQUET TIME:    Total Tourniquet Time Documented: Upper Arm (Right) - 46 minutes Total: Upper Arm (Right) - 46 minutes    DISPOSITION:  Stable to PACU.   INDICATIONS: 57 year old female with CMC arthritis of right thumb.  She has tried splinting and anti-inflammatories and injections without lasting relief.  She wishes to proceed with operative trapeziectomy and suspension plasty.  Risks, benefits and alternatives of surgery were discussed including the risks of blood loss, infection, damage to nerves, vessels, tendons, ligaments, bone for surgery, need for additional surgery, complications with wound healing, continued pain, stiffness.  She voiced understanding of these risks and elected to proceed.  OPERATIVE COURSE:  After being identified preoperatively by myself,  the patient and I agreed on the procedure and site of the procedure.  The surgical site was marked.  Surgical consent had been signed. Preoperative IV antibiotic prophylaxis was given. She was transferred to the operating room and placed on the operating table in supine position with the right upper extremity on an arm board.  Sedation was induced by the anesthesiologist. A regional block had been performed by anesthesia in preoperative holding.    Right upper extremity was prepped and draped in  normal sterile orthopedic fashion.  A surgical pause was performed between the surgeons, anesthesia, and operating room staff and all were in agreement as to the patient, procedure, and site of procedure.  Tourniquet at the proximal aspect of the extremity was inflated to 250 mmHg after exsanguination of the arm with an Esmarch bandage.  Incision was made at the dorsum of the thumb over the [facility] joint.  This was carried in subcutaneous tissues by spreading technique.  Bipolar electrocautery is used to obtain hemostasis.  Cutaneous branches of nerve were identified and protected.  The deep branch of the radial artery was identified and protected throughout the case.  The capsule was sharply incised.  The trapezium was freed up of soft tissue attachments using the knife and Therapist, nutritional.  The rondure was used to remove the trapezium in a piecemeal fashion.  Once the trapezium had been removed to the FCR tendon was placed under traction and the anterior half of the tendon harvested creating a distally based graft.  The guidewire from the micro link anchor was then used.  It was placed through the base of the thumb metacarpal just anterior to the APL tendon insertion and then through the index finger metacarpal.  The C-arm was used in AP lateral and oblique projections to ensure appropriate position of the guidepin which was the case.  The microlink anchor was then passed after making incision on the dorsum of the hand.  The anchor was then tied over the dorsum of the index finger metacarpal while keeping a hemostat between the index and thumb metacarpals to prevent overtightening.  The dorsal  wound was then closed with 4-0 nylon in a horizontal mattress fashion.  The FCR tendon graft was then passed through the APL tendon and back onto itself.  This was secured with FiberWire suture both at its base and through the APL tendon.  There was good suspension of the thumb metacarpal.  The wound was copiously irrigated with  sterile saline.  The capsule was repaired with a 4-0 Vicryl suture in a figure-of-eight fashion.  The skin was closed with 4-0 nylon in a horizontal mattress fashion.  The wound was dressed with sterile Xeroform 4 x 4's and wrapped with a Kerlix bandage.  Thumb spica splint was placed and wrapped with Kerlix and Ace bandage.  The tourniquet was deflated at 46 minutes.  Fingertips were pink with brisk capillary refill after deflation of tourniquet.  The operative  drapes were broken down.  The patient was awoken from anesthesia safely.  She was transferred back to the stretcher and taken to PACU in stable condition.  I will see her back in the office in 1 week for postoperative followup.  I will give her a prescription for Percocet 5/325 1-2 tabs PO q6 hours prn pain, dispense # 25.   Betha Loa, MD Electronically signed, 04/13/23

## 2023-04-13 NOTE — H&P (Signed)
Kim Irwin is an 57 y.o. female.   Chief Complaint: cmc arthritis HPI: 57 yo female with right thumb cmc arthritis.  She has tried non operative measures without lasting relief.  She wishes to proceed with trapeziectomy and suspensionplasty.  Allergies:  Allergies  Allergen Reactions   Macrobid [Nitrofurantoin] Other (See Comments)    Flu like symptoms   Diltiazem Rash    Past Medical History:  Diagnosis Date   Allergy    macrobid   Anxiety    Arthritis    hands   GERD (gastroesophageal reflux disease)    Hearing loss    slight, no hearing aids   Migraine    otc med prn   Paroxysmal A-fib (HCC)    Pneumonia    hx - resolved   SVD (spontaneous vaginal delivery)    x 1   Urinary tract infection    Hx - resolved    Past Surgical History:  Procedure Laterality Date   ATRIAL FIBRILLATION ABLATION N/A 06/03/2022   Procedure: ATRIAL FIBRILLATION ABLATION;  Surgeon: Lanier Prude, MD;  Location: [facility];  Service: Cardiovascular;  Laterality: N/A;   COLONOSCOPY  09/2003   Normal - Russella Dar   endometrial ablation     TUBAL LIGATION     WRIST SURGERY Right     Family History: Family History  Problem Relation Age of Onset   Hyperlipidemia Father    Asthma Father    Other Mother    Heart disease Mother    Stomach cancer Paternal Grandfather    Colon cancer Neg Hx    Esophageal cancer Neg Hx    Rectal cancer Neg Hx    Breast cancer Neg Hx     Social History:   reports that she quit smoking about 26 years ago. Her smoking use included cigarettes. She started smoking about 41 years ago. She has a 3 pack-year smoking history. She has never used smokeless tobacco. She reports that she does not currently use alcohol after a past usage of about 10.0 standard drinks of alcohol per week. She reports that she does not currently use drugs.  Medications: Medications Prior to Admission  Medication Sig Dispense Refill   ascorbic acid (VITAMIN C) 500 MG tablet  Take 286 mg by mouth daily.     aspirin EC 81 MG tablet Take 1 tablet (81 mg total) by mouth daily. Swallow whole. 90 tablet 3   magnesium oxide (MAG-OX) 400 (240 Mg) MG tablet Take 400 mg by mouth daily.     pantoprazole (PROTONIX) 40 MG tablet Take 1 tablet (40 mg total) by mouth daily. 90 tablet 1   tetrahydrozoline-zinc (VISINE-AC) 0.05-0.25 % ophthalmic solution Place 2 drops into both eyes 3 (three) times daily as needed (dry eyes).      No results found for this or any previous visit (from the past 48 hour(s)).  No results found.    Height 5\' 7"  (1.702 m), weight 81.6 kg, last menstrual period 08/07/2011.  General appearance: alert, cooperative, and appears stated age Head: Normocephalic, without obvious abnormality, atraumatic Neck: supple, symmetrical, trachea midline Extremities: Intact sensation and capillary refill all digits.  +epl/fpl/io.  No wounds.  Pulses: 2+ and symmetric Skin: Skin color, texture, turgor normal. No rashes or lesions Neurologic: Grossly normal Incision/Wound: none  Assessment/Plan Right thumb cmc arthritis.  Non operative and operative treatment options have been discussed with the patient and patient wishes to proceed with operative treatment. Risks, benefits, and alternatives of surgery  have been discussed and the patient agrees with the plan of care.   Betha Loa 04/13/2023, 9:19 AM

## 2023-04-13 NOTE — Anesthesia Postprocedure Evaluation (Signed)
Anesthesia Post Note  Patient: Kim Irwin  Procedure(s) Performed: RIGHT THUMB TRAPEZIECTOMY AND SUSPENSIONPLASTY (Right: Thumb) TENDON TRANSFER (Right: Thumb)     Patient location during evaluation: PACU Anesthesia Type: Regional and MAC Level of consciousness: awake and alert Pain management: pain level controlled Vital Signs Assessment: post-procedure vital signs reviewed and stable Respiratory status: spontaneous breathing, nonlabored ventilation, respiratory function stable and patient connected to nasal cannula oxygen Cardiovascular status: stable and blood pressure returned to baseline Postop Assessment: no apparent nausea or vomiting Anesthetic complications: no  No notable events documented.  Last Vitals:  Vitals:   04/13/23 1345 04/13/23 1350  BP: (!) 142/75   Pulse:    Resp:  15  Temp:    SpO2: 93%     Last Pain:  Vitals:   04/13/23 1345  TempSrc:   PainSc: 0-No pain                 [name]

## 2023-04-13 NOTE — Op Note (Signed)
I assisted Surgeons and Role:    * Betha Loa, MD - Primary    Cindee Salt, MD - Assisting on the Procedure(s): RIGHT THUMB TRAPEZIECTOMY AND SUSPENSIONPLASTY TENDON TRANSFER on 04/13/2023.  I provided assistance on this case as follows: Set up, approach, identification protection radial artery and nerve, isolation of the trapezium, removal of the trapezium, harvesting of the FCR for tendon transfer, placement of the microleak suture, transfer of the FCR tendon, closure of the wound and application of the dressing and splint.  Electronically signed by: Cindee Salt, MD Date: 04/13/2023 Time: 1:28 PM

## 2023-04-13 NOTE — Discharge Instructions (Addendum)
[facility] Instructions Hand Surgery  Wound Care: Keep your hand elevated above the level of your heart.  Do not allow it to dangle by your side.  Keep the dressing dry and do not remove it unless your doctor advises you to do so.  He will usually change it at the time of your post-op visit.  Moving your fingers is advised to stimulate circulation but will depend on the site of your surgery.  If you have a splint applied, your doctor will advise you regarding movement.  Activity: Do not drive or operate machinery today.  Rest today and then you may return to your normal activity and work as indicated by your physician.  Diet:  Drink liquids today or eat a light diet.  You may resume a regular diet tomorrow.    General expectations: Pain for two to three days. Fingers may become slightly swollen.  Call your doctor if any of the following occur: Severe pain not relieved by pain medication. Elevated temperature. Dressing soaked with blood. Inability to move fingers. White or bluish color to fingers.    Post Anesthesia Home Care Instructions  Activity: Get plenty of rest for the remainder of the day. A responsible individual must stay with you for 24 hours following the procedure.  For the next 24 hours, DO NOT: -Drive a car -Advertising copywriter -Drink alcoholic beverages -Take any medication unless instructed by your physician -Make any legal decisions or sign important papers.  Meals: Start with liquid foods such as gelatin or soup. Progress to regular foods as tolerated. Avoid greasy, spicy, heavy foods. If nausea and/or vomiting occur, drink only clear liquids until the nausea and/or vomiting subsides. Call your physician if vomiting continues.  Special Instructions/Symptoms: Your throat may feel dry or sore from the anesthesia or the breathing tube placed in your throat during surgery. If this causes discomfort, gargle with warm salt water. The discomfort should disappear  within 24 hours.  If you had a scopolamine patch placed behind your ear for the management of post- operative nausea and/or vomiting:  1. The medication in the patch is effective for 72 hours, after which it should be removed.  Wrap patch in a tissue and discard in the trash. Wash hands thoroughly with soap and water. 2. You may remove the patch earlier than 72 hours if you experience unpleasant side effects which may include dry mouth, dizziness or visual disturbances. 3. Avoid touching the patch. Wash your hands with soap and water after contact with the patch.  Regional Anesthesia Blocks  1. You may not be able to move or feel the "blocked" extremity after a regional anesthetic block. This may last may last from 3-48 hours after placement, but it will go away. The length of time depends on the medication injected and your individual response to the medication. As the nerves start to wake up, you may experience tingling as the movement and feeling returns to your extremity. If the numbness and inability to move your extremity has not gone away after 48 hours, please call your surgeon.   2. The extremity that is blocked will need to be protected until the numbness is gone and the strength has returned. Because you cannot feel it, you will need to take extra care to avoid injury. Because it may be weak, you may have difficulty moving it or using it. You may not know what position it is in without looking at it while the block is in effect.  3.  For blocks in the legs and feet, returning to weight bearing and walking needs to be done carefully. You will need to wait until the numbness is entirely gone and the strength has returned. You should be able to move your leg and foot normally before you try and bear weight or walk. You will need someone to be with you when you first try to ensure you do not fall and possibly risk injury.  4. Bruising and tenderness at the needle site are common side effects  and will resolve in a few days.  5. Persistent numbness or new problems with movement should be communicated to the surgeon or the [facility] [phone] [facility] [phone]).

## 2023-04-13 NOTE — Anesthesia Procedure Notes (Signed)
Anesthesia Regional Block: Supraclavicular block   Pre-Anesthetic Checklist: , timeout performed,  Correct Patient, Correct Site, Correct Laterality,  Correct Procedure, Correct Position, site marked,  Risks and benefits discussed,  Pre-op evaluation,  At surgeon's request and post-op pain management  Laterality: Right  Prep: Maximum Sterile Barrier Precautions used, chloraprep       Needles:  Injection technique: Single-shot  Needle Type: Echogenic Stimulator Needle     Needle Length: 5cm  Needle Gauge: 21     Additional Needles:   Procedures:,,,, ultrasound used (permanent image in chart),,    Narrative:  Start time: 04/13/2023 10:45 AM End time: 04/13/2023 10:48 AM Injection made incrementally with aspirations every 5 mL. Anesthesiologist: Elmer Picker, MD

## 2023-04-14 ENCOUNTER — Encounter (HOSPITAL_BASED_OUTPATIENT_CLINIC_OR_DEPARTMENT_OTHER): Payer: Self-pay | Admitting: Orthopedic Surgery

## 2023-06-26 ENCOUNTER — Other Ambulatory Visit: Payer: Self-pay

## 2023-06-26 ENCOUNTER — Encounter: Payer: Self-pay | Admitting: *Deleted

## 2023-06-26 ENCOUNTER — Ambulatory Visit
Admission: EM | Admit: 2023-06-26 | Discharge: 2023-06-26 | Disposition: A | Discharge status: Home / Self Care | Payer: BC Managed Care – PPO | Attending: Family Medicine | Admitting: Family Medicine

## 2023-06-26 DIAGNOSIS — N390 Urinary tract infection, site not specified: Secondary | ICD-10-CM | POA: Diagnosis present

## 2023-06-26 LAB — POCT URINALYSIS DIP (MANUAL ENTRY)
Bilirubin, UA: NEGATIVE
Glucose, UA: NEGATIVE mg/dL
Ketones, POC UA: NEGATIVE mg/dL
Nitrite, UA: NEGATIVE
Protein Ur, POC: NEGATIVE mg/dL
Spec Grav, UA: 1.01 (ref 1.010–1.025)
Urobilinogen, UA: 0.2 U/dL
pH, UA: 7 (ref 5.0–8.0)

## 2023-06-26 MED ORDER — SULFAMETHOXAZOLE-TRIMETHOPRIM 800-160 MG PO TABS: 1.0000 | ORAL_TABLET | Freq: Two times a day (BID) | ORAL | 0 refills | Status: AC

## 2023-06-26 NOTE — ED Provider Notes (Signed)
[facility]    CSN: [id number] Arrival date & time: 06/26/23  1505      History   Chief Complaint Chief Complaint  Patient presents with   Dysuria    HPI Kim Irwin is a 57 y.o. female.  Patient presents today for evaluation of dysuria and urinary frequency times today.  Has no history of recurrent UTIs.Denies any hematuria.  Patient denies chills, fever,nausea or vomiting. 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 09/05/2020   History of anxiety 09/05/2020   Insomnia disorder related to known organic factor 01/29/2019   Carpal tunnel syndrome 01/29/2019   Bilateral carpal tunnel syndrome 01/03/2019    Past Surgical History:  Procedure Laterality Date   ATRIAL FIBRILLATION ABLATION N/A 06/03/2022   Procedure: ATRIAL FIBRILLATION ABLATION;  Surgeon: Lanier Prude, MD;  [facility]: [facility];  Service: Cardiovascular;  Laterality: N/A;   CARPOMETACARPEL SUSPENSION PLASTY Right 04/13/2023   Procedure: RIGHT THUMB TRAPEZIECTOMY AND SUSPENSIONPLASTY;  Surgeon: Betha Loa, MD;  Location: [facility];  Service: Orthopedics;  Laterality: Right;   COLONOSCOPY  09/2003   Normal - [facility]   endometrial ablation     TENDON TRANSFER Right 04/13/2023   Procedure: TENDON TRANSFER;  Surgeon: Betha Loa, MD;  Location: [facility];  Service: Orthopedics;   Laterality: Right;   TUBAL LIGATION     WRIST SURGERY Right     OB History   No obstetric history on file.      Home Medications    Prior to Admission medications   Medication Sig Start Date End Date Taking? Authorizing Provider  ascorbic acid (VITAMIN C) 500 MG tablet Take 286 mg by mouth daily.   Yes [provider]  aspirin EC 81 MG tablet Take 1 tablet (81 mg total) by mouth daily. Swallow whole. 09/07/22  Yes Lanier Prude, MD  magnesium oxide (MAG-OX) 400 (240 Mg) MG tablet Take 400 mg by mouth daily.   Yes [provider]  sulfamethoxazole-trimethoprim (BACTRIM DS) 800-160 MG tablet Take 1 tablet by mouth 2 (two) times daily. 06/26/23  Yes Bing Neighbors, NP  oxyCODONE-acetaminophen (PERCOCET) 5-325 MG tablet 1-2 tabs PO q6 hours prn pain 04/13/23   Betha Loa, MD  pantoprazole (PROTONIX) 40 MG tablet Take 1 tablet (40 mg total) by mouth daily. 09/07/22   Lanier Prude, MD  tetrahydrozoline-zinc (VISINE-AC) 0.05-0.25 % ophthalmic solution Place 2 drops into both eyes 3 (three) times daily as needed (dry eyes).    [provider]    Family History Family History  Problem Relation Age of Onset   Hyperlipidemia Father    Asthma Father    Other Mother    Heart disease Mother    Stomach cancer Paternal Grandfather  Colon cancer Neg Hx    Esophageal cancer Neg Hx    Rectal cancer Neg Hx    Breast cancer Neg Hx     Social History Social History   Tobacco Use   Smoking status: Former    Current packs/day: 0.00    Average packs/day: 0.2 packs/day for 15.0 years (3.0 ttl pk-yrs)    Types: Cigarettes    Start date: 07/18/1981    Quit date: 07/18/1996    Years since quitting: 26.9   Smokeless tobacco: Never   Tobacco comments:    Former smoker 07/07/22  Vaping Use   Vaping status: Never Used  Substance Use Topics   Alcohol use: Yes    Alcohol/week: 10.0 standard drinks of alcohol    Types: 10 Cans of beer per week   Drug use:  Not Currently    Comment: Phentermine, Tramadol     Allergies   Macrobid [nitrofurantoin] and Diltiazem   Review of Systems Review of Systems  Genitourinary:  Positive for dysuria.     Physical Exam Triage Vital Signs ED Triage Vitals [06/26/23 1636]  Encounter Vitals Group     BP      Systolic BP Percentile      Diastolic BP Percentile      Pulse      Resp      Temp      Temp src      SpO2      Weight      Height      Head Circumference      Peak Flow      Pain Score 4     Pain Loc      Pain Education      Exclude from Growth Chart    No data found.  Updated Vital Signs BP (!) 148/99 (BP Location: Left Arm)   Pulse 99   Temp 98.4 F (36.9 C) (Oral)   Resp 18   LMP 08/07/2011   SpO2 98%   Visual Acuity Right Eye Distance:   Left Eye Distance:   Bilateral Distance:    Right Eye Near:   Left Eye Near:    Bilateral Near:     Physical Exam General appearance: Alert, well developed, well nourished, cooperative and  no distress Head: Normocephalic, without obvious abnormality, atraumatic Respiratory: Respirations even and unlabored, normal respiratory rate Heart: Rate and rhythm normal.   CVA:  no flank pain Extremities: No gross deformities Skin: Skin color, texture, turgor normal. No rashes seen  Psych: Appropriate mood and affect.   UC Treatments / Results  Labs (all labs ordered are listed, but only abnormal results are displayed) Labs Reviewed  POCT URINALYSIS DIP (MANUAL ENTRY) - Abnormal; Notable for the following components:      Result Value   Clarity, UA cloudy (*)    Blood, UA trace-intact (*)    Leukocytes, UA Large (3+) (*)    All other components within normal limits  URINE CULTURE    EKG   Radiology No results found.  Procedures Procedures (including critical care time)  Medications Ordered in UC Medications - No data to display  Initial Impression / Assessment and Plan / UC Course  I have reviewed the triage vital  signs and the nursing notes.  Pertinent labs & imaging results that were available during my care of the patient were reviewed by me and considered in my medical decision making (see chart for details).       UA abnormal  and findings consistent with UTI. Empiric antibiotic treatment initiated, Bactrim 1 tablet BID x 10 days.Encouraged increase intake of water. Recommended cranberry tablets, wiping from front to back, and urinating prior to and after cortis to reduce risk for reinfection. Urine culture pending.  ER if symptoms become severe. Follow-up with PCP if symptoms do not completely resolve.  Final Clinical Impressions(s) / UC Diagnoses   Final diagnoses:  Acute UTI (urinary tract infection)     Discharge Instructions      Urine culture pending.  Our office will notify you if you require change in treatment.  Start antibiotics immediately and take as directed.  Hydrate well with fluids.  Return as needed.     ED Prescriptions     Medication Sig Dispense Auth. Provider   sulfamethoxazole-trimethoprim (BACTRIM DS) 800-160 MG tablet Take 1 tablet by mouth 2 (two) times daily. 10 tablet Bing Neighbors, NP      PDMP not reviewed this encounter.   Bing Neighbors, NP 06/27/23 [phone]

## 2023-06-26 NOTE — Discharge Instructions (Signed)
Urine culture pending.  Our office will notify you if you require change in treatment.  Start antibiotics immediately and take as directed.  Hydrate well with fluids.  Return as needed.

## 2023-06-26 NOTE — ED Triage Notes (Signed)
C/o dysuria and frequency with cloudy urine since this morning

## 2023-06-27 ENCOUNTER — Ambulatory Visit: Payer: BC Managed Care – PPO

## 2023-06-28 LAB — URINE CULTURE
Culture: >=100000 — AB
Special Requests: NORMAL

## 2024-02-12 ENCOUNTER — Other Ambulatory Visit: Payer: Self-pay | Admitting: Obstetrics and Gynecology

## 2024-02-12 DIAGNOSIS — Z1231 Encounter for screening mammogram for malignant neoplasm of breast: Secondary | ICD-10-CM

## 2024-03-04 ENCOUNTER — Other Ambulatory Visit: Payer: Self-pay | Admitting: Obstetrics and Gynecology

## 2024-03-04 ENCOUNTER — Other Ambulatory Visit (HOSPITAL_COMMUNITY)
Admission: RE | Admit: 2024-03-04 | Discharge: 2024-03-04 | Disposition: A | Discharge status: Home / Self Care | Source: Ambulatory Visit | Attending: Obstetrics and Gynecology | Admitting: Obstetrics and Gynecology

## 2024-03-04 DIAGNOSIS — Z01419 Encounter for gynecological examination (general) (routine) without abnormal findings: Secondary | ICD-10-CM | POA: Insufficient documentation

## 2024-03-08 LAB — CYTOLOGY - PAP
Comment: NEGATIVE
Diagnosis: REACTIVE
High risk HPV: NEGATIVE

## 2024-03-22 ENCOUNTER — Ambulatory Visit
Admission: RE | Admit: 2024-03-22 | Discharge: 2024-03-22 | Disposition: A | Discharge status: Home / Self Care | Source: Ambulatory Visit | Attending: Obstetrics and Gynecology | Admitting: Obstetrics and Gynecology

## 2024-03-22 DIAGNOSIS — Z1231 Encounter for screening mammogram for malignant neoplasm of breast: Secondary | ICD-10-CM

## 2024-05-15 NOTE — Progress Notes (Unsigned)
 Electrophysiology Office Follow up Visit Note:    Date:  05/16/2024   ID:  Keitra, Carusone [birth year], MRN [id number]  PCP:  Cristopher Suzen HERO, NP  [facility] Cardiologist:  Lonni LITTIE Nanas, MD  [facility] Electrophysiologist:  OLE ONEIDA HOLTS, MD    Interval History:     CHENOAH MCNALLY is a 58 y.o. female who presents for a follow up visit.   I last saw the patient September 07, 2022.  The patient has a history of atrial fibrillation with a prior catheter ablation.  At the last appointment she agreed to take aspirin  monotherapy despite my recommendation to take Eliquis  given an elevated CHA2DS2-VASc and a history of TIA.  We recommended 73-month follow-up.  She is doing well today.  She tells me that she noticed that her heart was out of rhythm but is unable to tell me exactly when this started occurring.  She think she is going in and out of atrial fibrillation.  She cannot correlate the atrial fibrillation to any specific symptom but does report intermittent fatigue.  No syncope or presyncope.  She takes aspirin  81 once a day.  She is not on any nodal blockers.        Past medical, surgical, social and family history were reviewed.  ROS:   Please see the history of present illness.    All other systems reviewed and are negative.  EKGs/Labs/Other Studies Reviewed:    The following studies were reviewed today:     EKG Interpretation Date/Time:  Thursday May 16 2024 14:10:54 EDT Ventricular Rate:  116 PR Interval:    QRS Duration:  72 QT Interval:  328 QTC Calculation: 455 R Axis:   74  Text Interpretation: Atrial fibrillation with rapid ventricular response Confirmed by Holts Ole [phone]) on 05/16/2024 2:13:16 PM    Physical Exam:    VS:  BP 134/84 (BP Location: Left Arm, Patient Position: Sitting, Cuff Size: Normal)   Pulse (!) 116   Ht 5' 7 (1.702 m)   Wt 153 lb (69.4 kg)   LMP 08/07/2011   SpO2 99%   BMI 23.96 kg/m      Wt Readings from Last 3 Encounters:  05/16/24 153 lb (69.4 kg)  04/13/23 175 lb 7.8 oz (79.6 kg)  04/05/23 179 lb (81.2 kg)     GEN: no distress CARD: Tachycardic, irregularly irregular, No MRG RESP: No IWOB. CTAB.      ASSESSMENT:    1. Paroxysmal atrial fibrillation (HCC)   2. TIA (transient ischemic attack)    PLAN:    In order of problems listed above:  #Atrial fibrillation #History of TIA Recurrent. Prior catheter ablation in November 2023. I would first like to establish whether or not she is in paroxysmal or persistent atrial fibrillation.  I will have her wear a 2-week ZIO monitor.  She will stop her aspirin  and restart Eliquis  5 mg by mouth twice daily.  She will get a CBC today to reestablish her baseline hemoglobin.  She will follow-up in 6 to 8 weeks with Dr. Almetta to review the results and establish a treatment plan.  She is a good candidate for redo catheter ablation.  I will also have her start metoprolol  succinate 25 mg by mouth once daily at night.  I discussed my upcoming departure from [facility].  She will transition her care to one of my EP partners moving forward.  Follow-up with Dr. Almetta in 6 to 8 weeks.  Signed,  Ole Holts, MD, [facility], [facility] 05/16/2024 2:25 PM    Electrophysiology [facility]

## 2024-05-16 ENCOUNTER — Other Ambulatory Visit: Payer: Self-pay

## 2024-05-16 ENCOUNTER — Encounter: Payer: Self-pay | Admitting: Cardiology

## 2024-05-16 ENCOUNTER — Ambulatory Visit: Attending: Internal Medicine | Admitting: Cardiology

## 2024-05-16 ENCOUNTER — Other Ambulatory Visit (HOSPITAL_COMMUNITY): Payer: Self-pay

## 2024-05-16 ENCOUNTER — Ambulatory Visit: Attending: Cardiology

## 2024-05-16 VITALS — BP 134/84 | HR 116 | Ht 67.0 in | Wt 153.0 lb

## 2024-05-16 DIAGNOSIS — G459 Transient cerebral ischemic attack, unspecified: Secondary | ICD-10-CM

## 2024-05-16 DIAGNOSIS — I480 Paroxysmal atrial fibrillation: Secondary | ICD-10-CM

## 2024-05-16 LAB — CBC
Hematocrit: 43 % (ref 34.0–46.6)
Hemoglobin: 14.4 g/dL (ref 11.1–15.9)
MCH: 31.9 pg (ref 26.6–33.0)
MCHC: 33.5 g/dL (ref 31.5–35.7)
MCV: 95 fL (ref 79–97)
Platelets: 266 x10E3/uL (ref 150–450)
RBC: 4.51 x10E6/uL (ref 3.77–5.28)
RDW: 13.2 % (ref 11.7–15.4)
WBC: 8.4 x10E3/uL (ref 3.4–10.8)

## 2024-05-16 MED ORDER — METOPROLOL SUCCINATE ER 25 MG PO TB24
25.0000 mg | ORAL_TABLET | Freq: Every day | ORAL | 3 refills | Status: DC
  Filled 2024-05-16: qty 90, 90d supply, fill #0

## 2024-05-16 MED ORDER — APIXABAN 5 MG PO TABS
5.0000 mg | ORAL_TABLET | Freq: Two times a day (BID) | ORAL | 11 refills | Status: AC
  Filled 2024-05-16: qty 60, 30d supply, fill #0
  Filled 2024-07-19: qty 60, 30d supply, fill #1

## 2024-05-16 NOTE — Patient Instructions (Addendum)
 Medication Instructions:  Your physician has recommended you make the following change in your medication:  1) STOP taking aspirin   2) START taking Eliquis  5 mg twice daily  3) START taking Toprol  XL (metoprolol  succinate) 25 mg once every night *If you need a refill on your cardiac medications before your next appointment, please call your pharmacy*  Labs:  TODAY: CBC  Testing/Procedures: Event Monitor  Your physician has recommended that you wear an event monitor. Event monitors are medical devices that record the heart's electrical activity. Doctors most often us  these monitors to diagnose arrhythmias. Arrhythmias are problems with the speed or rhythm of the heartbeat. The monitor is a small, portable device. You can wear one while you do your normal daily activities. This is usually used to diagnose what is causing palpitations/syncope (passing out).  Follow-Up: At [facility], you and your health needs are our priority.  As part of our continuing mission to provide you with exceptional heart care, our providers are all part of one team.  This team includes your primary Cardiologist (physician) and Advanced Practice Providers or APPs (Physician Assistants and Nurse Practitioners) who all work together to provide you with the care you need, when you need it.  Your next appointment:   6-8 weeks  Provider:   Donnice Primus, MD    GEOFFRY HEWS- Long Term Monitor Instructions  Your physician has requested you wear a ZIO patch monitor for 14 days.  This is a single patch monitor. [employer] supplies one patch monitor per enrollment. Additional stickers are not available. Please do not apply patch if you will be having a Nuclear Stress Test,  Echocardiogram, Cardiac CT, MRI, or Chest Xray during the period you would be wearing the  monitor. The patch cannot be worn during these tests. You cannot remove and re-apply the  ZIO XT patch monitor.  Your ZIO patch monitor will be mailed 3 day  USPS to your address on file. It may take 3-5 days  to receive your monitor after you have been enrolled.  Once you have received your monitor, please review the enclosed instructions. Your monitor  has already been registered assigning a specific monitor serial # to you.  Billing and Patient Assistance Program Information  We have supplied [employer] with any of your insurance information on file for billing purposes. [employer] offers a sliding scale Patient Assistance Program for patients that do not have  insurance, or whose insurance does not completely cover the cost of the ZIO monitor.  You must apply for the Patient Assistance Program to qualify for this discounted rate.  To apply, please call [employer] at [phone], select option 4, select option 2, ask to apply for  Patient Assistance Program. Meredeth will ask your household income, and how many people  are in your household. They will quote your out-of-pocket cost based on that information.  [employer] will also be able to set up a 11-month, interest-free payment plan if needed.  Applying the monitor   Shave hair from upper left chest.  Hold abrader disc by orange tab. Rub abrader in 40 strokes over the upper left chest as  indicated in your monitor instructions.  Clean area with 4 enclosed alcohol pads. Let dry.  Apply patch as indicated in monitor instructions. Patch will be placed under collarbone on left  side of chest with arrow pointing upward.  Rub patch adhesive wings for 2 minutes. Remove white label marked 1. Remove the white  label marked 2. Rub patch  adhesive wings for 2 additional minutes.  While looking in a mirror, press and release button in center of patch. A small green light will  flash 3-4 times. This will be your only indicator that the monitor has been turned on.  Do not shower for the first 24 hours. You may shower after the first 24 hours.  Press the button if you feel a symptom. You will hear a small  click. Record Date, Time and  Symptom in the Patient Logbook.  When you are ready to remove the patch, follow instructions on the last 2 pages of Patient  Logbook. Stick patch monitor onto the last page of Patient Logbook.  Place Patient Logbook in the blue and white box. Use locking tab on box and tape box closed  securely. The blue and white box has prepaid postage on it. Please place it in the mailbox as  soon as possible. Your physician should have your test results approximately 7 days after the  monitor has been mailed back to [facility].  Call [facility] Customer Care at [phone] if you have questions regarding  your ZIO XT patch monitor. Call them immediately if you see an orange light blinking on your  monitor.  If your monitor falls off in less than 4 days, contact our Monitor department at [phone].  If your monitor becomes loose or falls off after 4 days call [employer] at [phone] for  suggestions on securing your monitor

## 2024-05-16 NOTE — Progress Notes (Unsigned)
 Enrolled patent for a 14 day Zio XT monitor to be mailed to patients home

## 2024-05-17 ENCOUNTER — Encounter: Payer: Self-pay | Admitting: Cardiology

## 2024-05-17 NOTE — Telephone Encounter (Signed)
 Patient returned call. She understands that her anatomy is too shallow for the Watchman device. She reports no issues with bleeding. Reiterated to her that insurance coverage is becoming increasingly difficult with patients who have not required medical treatment and she may not be authorized regardless. Discussed Amulet and she understands that device is not implanted at [facility]. She stated she would think about it and may call back to request a referral.  She was grateful for assistance.

## 2024-05-17 NOTE — Telephone Encounter (Signed)
 Left message to call back.  Unfortunately, the patient's appendage was too shallow for devices in 2023 and the Pro devices will still need more depth.

## 2024-05-22 ENCOUNTER — Encounter: Payer: Self-pay | Admitting: Cardiology

## 2024-06-11 ENCOUNTER — Telehealth: Payer: Self-pay | Admitting: Student in an Organized Health Care Education/Training Program

## 2024-06-11 ENCOUNTER — Telehealth: Payer: Self-pay | Admitting: Cardiology

## 2024-06-11 NOTE — Telephone Encounter (Signed)
 error

## 2024-06-11 NOTE — Telephone Encounter (Signed)
 Patient didn't receive her heart monitor to late. Calling to see if appt on 12/2 is still need or do it need to be r/s. Please advise

## 2024-06-18 ENCOUNTER — Ambulatory Visit: Admitting: Student in an Organized Health Care Education/Training Program

## 2024-06-26 ENCOUNTER — Ambulatory Visit: Payer: Self-pay | Admitting: Cardiology

## 2024-06-26 DIAGNOSIS — G459 Transient cerebral ischemic attack, unspecified: Secondary | ICD-10-CM | POA: Diagnosis not present

## 2024-06-26 DIAGNOSIS — I480 Paroxysmal atrial fibrillation: Secondary | ICD-10-CM

## 2024-07-10 NOTE — Progress Notes (Deleted)
" °  Electrophysiology Office Note:   Date:  07/10/2024  ID:  Davionne, Mastrangelo [birth year], MRN [id number]  Primary Cardiologist: Lonni LITTIE Nanas, MD Primary Heart Failure: None Electrophysiologist: OLE ONEIDA HOLTS, MD  {Click to update primary MD,subspecialty MD or APP then REFRESH:1}    History of Present Illness:   Kim Irwin is a 58 y.o. female with h/o AF, TIA, anxiety seen today for routine electrophysiology followup.   Since last being seen in our clinic the patient reports doing ***.    She*** denies chest pain, palpitations, dyspnea, PND, orthopnea, nausea, vomiting, dizziness, syncope, edema, weight gain, or early satiety.   Review of systems complete and found to be negative unless listed in HPI.   EP Information / Studies Reviewed:    EKG is not ordered today. EKG from 05/16/24 reviewed which showed AFwRVR       Arrhythmia / AAD / Pertinent EP Studies AF EPS 06/03/2022 > PVI + PW ablation with RF Not a candidate for LAAO due to shallow appendage depth Cardiac Monitor 06/2024 > HR 57-191, ave 83 bpm, 13% AF burden, rare ventricular ectopy   Risk Assessment/Calculations:    CHA2DS2-VASc Score = 4  {Confirm score is correct.  If not, click here to update score.  REFRESH note.  :1} This indicates a 4.8% annual risk of stroke. The patient's score is based upon: CHF History: 0 HTN History: 1 Diabetes History: 0 Stroke History: 2 Vascular Disease History: 0 Age Score: 0 Gender Score: 1   {This patient has a significant risk of stroke if diagnosed with atrial fibrillation.  Please consider VKA or DOAC agent for anticoagulation if the bleeding risk is acceptable.   You can also use the SmartPhrase .[email or web address] for documentation.   :[id number]} No BP recorded.  {Refresh Note OR Click here to enter BP  :1}***        Physical Exam:   VS:  LMP 08/07/2011    Wt Readings from Last 3 Encounters:  05/16/24 153 lb (69.4 kg)  04/13/23 175 lb 7.8 oz (79.6  kg)  04/05/23 179 lb (81.2 kg)     GEN: Well nourished, well developed in no acute distress NECK: No JVD; No carotid bruits CARDIAC: {EPRHYTHM:28826}, no murmurs, rubs, gallops RESPIRATORY:  Clear to auscultation without rales, wheezing or rhonchi  ABDOMEN: Soft, non-tender, non-distended EXTREMITIES:  No edema; No deformity   ASSESSMENT AND PLAN:    Paroxysmal Atrial Fibrillation  CHA2DS2-VASc 4, s/p PVI+PW ablation 2023  -OAC for stroke prophylaxis  -metoprolol  succinate 25 mg at bedtime  -ablation reviewed,  PFA concept, will review with Dr. Almetta to add her to scheduled and bring her back to discuss further before procedure ***  Secondary Hypercoagulable State  -continue Eliquis  5mg  BID, dose reviewed and appropriate by age / wt  -no bleeding events ***   Hx TIA  -Eliquis  as above  -per Neurology    Follow up with Dr. Almetta {EPFOLLOW LE:71826}  Signed, Daphne Barrack, NP-C, AGACNP-BC [facility] - Electrophysiology  07/10/2024, 7:36 AM  "

## 2024-07-12 ENCOUNTER — Ambulatory Visit (INDEPENDENT_AMBULATORY_CARE_PROVIDER_SITE_OTHER): Admitting: Pulmonary Disease

## 2024-07-12 DIAGNOSIS — D6869 Other thrombophilia: Secondary | ICD-10-CM

## 2024-07-12 DIAGNOSIS — G459 Transient cerebral ischemic attack, unspecified: Secondary | ICD-10-CM

## 2024-07-12 DIAGNOSIS — I480 Paroxysmal atrial fibrillation: Secondary | ICD-10-CM

## 2024-07-16 ENCOUNTER — Encounter: Payer: Self-pay | Admitting: Pulmonary Disease

## 2024-07-16 NOTE — Progress Notes (Signed)
Pt now showed for appt

## 2024-07-19 ENCOUNTER — Other Ambulatory Visit (HOSPITAL_BASED_OUTPATIENT_CLINIC_OR_DEPARTMENT_OTHER): Payer: Self-pay

## 2024-08-01 NOTE — Progress Notes (Unsigned)
" °  Electrophysiology Office Note:   Date:  08/02/2024  ID:  Kim Irwin, DOB 07-14-1966, MRN [id number]  Primary Cardiologist: Lonni LITTIE Nanas, MD Primary Heart Failure: None Electrophysiologist: Donnice DELENA Primus, MD      History of Present Illness:   Kim Irwin is a 59 y.o. female with h/o AF, TIA, anxiety seen today for routine electrophysiology followup.   Since last being seen in our clinic the patient reports she celebrated her birthday last night. She reports she is hung over but had a good time. She notes she drinks 3-4 glasses of white wine 4 days per week. She reports she could not take the Toprol  as it made her feel terrible.  She notes she can tell when she is in AF but as of late not particularly symptomatic.   She denies chest pain, palpitations, dyspnea, PND, orthopnea, nausea, vomiting, dizziness, syncope, edema, weight gain, or early satiety.   Review of systems complete and found to be negative unless listed in HPI.   EP Information / Studies Reviewed:    EKG is ordered today. Personal review as below.  EKG Interpretation Date/Time:  Friday August 02 2024 08:25:34 EST Ventricular Rate:  73 PR Interval:  158 QRS Duration:  78 QT Interval:  386 QTC Calculation: 425 R Axis:   50  Text Interpretation: Normal sinus rhythm Confirmed by Aniceto Jarvis ([id number]) on 08/02/2024 1:31:33 PM    Arrhythmia / AAD / Pertinent EP Studies AF  EPS 06/03/2022 > PVI + PW ablation with RF Not a candidate for LAAO due to shallow appendage depth  Cardiac Monitor 06/2024 > HR 57-191, ave 83 bpm, 13% AF burden, rare ventricular ectopy   Physical Exam:   VS:  BP 138/88   Pulse 71   Ht 5' 7 (1.702 m)   Wt 159 lb (72.1 kg)   LMP 08/07/2011   SpO2 97%   BMI 24.90 kg/m    Wt Readings from Last 3 Encounters:  08/02/24 159 lb (72.1 kg)  05/16/24 153 lb (69.4 kg)  04/13/23 175 lb 7.8 oz (79.6 kg)     GEN: Well nourished, well developed in no acute distress NECK:  No JVD; No carotid bruits CARDIAC: Regular rate and rhythm, no murmurs, rubs, gallops RESPIRATORY:  Clear to auscultation without rales, wheezing or rhonchi  ABDOMEN: Soft, non-tender, non-distended EXTREMITIES:  No edema; No deformity   ASSESSMENT AND PLAN:    Paroxysmal Atrial Fibrillation  CHA2DS2-VASc 4, s/p PVI + PW ablation 2023. CAC score 0 in 03/2022 -OAC for stroke prophylaxis  -pt stopped toprol  due to feeling poorly on it   -consider redo ablation, 13% AF burden on recent monitor, PFA reviewed with patient   -she prefers ablation over medication options if possible - would not likely be able to use flecainide due to her intolerance of Metoprolol  and rash with Diltiazem  -discussed modifiable and non-modifiable risk factors for AF, encouraged no more than one 3 oz alcoholic beverage per day  Secondary Hypercoagulable State  -continue Eliquis  5mg  BID, dose reviewed and appropriate by age / wt   Hx TIA  -eliquis  as above    Follow up with Dr. Primus in 3 months to discuss possible re-do ablation   Signed, Jarvis Aniceto, NP-C, AGACNP-BC [facility] - Electrophysiology  08/02/2024, 1:31 PM  "

## 2024-08-02 ENCOUNTER — Encounter: Payer: Self-pay | Admitting: Pulmonary Disease

## 2024-08-02 ENCOUNTER — Ambulatory Visit: Attending: Pulmonary Disease | Admitting: Pulmonary Disease

## 2024-08-02 VITALS — BP 138/88 | HR 71 | Ht 67.0 in | Wt 159.0 lb

## 2024-08-02 DIAGNOSIS — G459 Transient cerebral ischemic attack, unspecified: Secondary | ICD-10-CM

## 2024-08-02 DIAGNOSIS — I480 Paroxysmal atrial fibrillation: Secondary | ICD-10-CM | POA: Diagnosis not present

## 2024-08-02 DIAGNOSIS — D6869 Other thrombophilia: Secondary | ICD-10-CM | POA: Diagnosis not present

## 2024-08-02 MED ORDER — METOPROLOL TARTRATE 25 MG PO TABS: 25.0000 mg | ORAL_TABLET | Freq: Three times a day (TID) | ORAL | 1 refills | Status: AC | PRN

## 2024-08-02 NOTE — Patient Instructions (Signed)
 Medication Instructions:  1.STOP Toprol  XL (metoprolol  succinate).   2.We will give you a new Rx for metoprolol  tartrate 25mg  to use if you have sustained HR > 115bpm   *If you need a refill on your cardiac medications before your next appointment, please call your pharmacy*  Lab Work: No lab work today If you have labs (blood work) drawn today and your tests are completely normal, you will receive your results only by: MyChart Message (if you have MyChart) OR A paper copy in the mail If you have any lab test that is abnormal or we need to change your treatment, we will call you to review the results.  Testing/Procedures: No testing/procedures were scheduled today  Follow-Up: At [facility], you and your health needs are our priority.  As part of our continuing mission to provide you with exceptional heart care, our providers are all part of one team.  This team includes your primary Cardiologist (physician) and Advanced Practice Providers or APPs (Physician Assistants and Nurse Practitioners) who all work together to provide you with the care you need, when you need it.  Your next appointment:   3 month(s)  Provider:   Dr. Almetta to discuss ablation
